# Patient Record
Sex: Male | Born: 1935 | Race: White | Hispanic: No | Marital: Married | Smoking: Never smoker
Health system: Southern US, Community
[De-identification: ages and names within clinical notes are randomized; demographics above are authoritative.]

## PROBLEM LIST (undated history)

## (undated) DIAGNOSIS — N289 Disorder of kidney and ureter, unspecified: Secondary | ICD-10-CM

## (undated) DIAGNOSIS — E119 Type 2 diabetes mellitus without complications: Secondary | ICD-10-CM

## (undated) DIAGNOSIS — F0390 Unspecified dementia without behavioral disturbance: Secondary | ICD-10-CM

## (undated) DIAGNOSIS — E079 Disorder of thyroid, unspecified: Secondary | ICD-10-CM

## (undated) HISTORY — PX: THYROID SURGERY: SHX805

## (undated) HISTORY — PX: ANKLE SURGERY: SHX546

## (undated) HISTORY — PX: HIP SURGERY: SHX245

---

## 2003-11-15 ENCOUNTER — Ambulatory Visit: Payer: Self-pay | Admitting: Radiation Oncology

## 2003-11-30 ENCOUNTER — Ambulatory Visit: Payer: Self-pay | Admitting: Radiation Oncology

## 2003-12-30 ENCOUNTER — Ambulatory Visit: Payer: Self-pay | Admitting: Radiation Oncology

## 2004-01-30 ENCOUNTER — Ambulatory Visit: Payer: Self-pay | Admitting: Radiation Oncology

## 2004-03-01 ENCOUNTER — Ambulatory Visit: Payer: Self-pay | Admitting: Radiation Oncology

## 2004-06-08 ENCOUNTER — Ambulatory Visit: Payer: Self-pay | Admitting: Radiation Oncology

## 2004-06-29 ENCOUNTER — Ambulatory Visit: Payer: Self-pay | Admitting: Radiation Oncology

## 2004-12-13 ENCOUNTER — Ambulatory Visit: Payer: Self-pay | Admitting: Radiation Oncology

## 2004-12-29 ENCOUNTER — Ambulatory Visit: Payer: Self-pay | Admitting: Radiation Oncology

## 2006-02-11 ENCOUNTER — Ambulatory Visit: Payer: Self-pay | Admitting: Radiation Oncology

## 2006-03-01 ENCOUNTER — Ambulatory Visit: Payer: Self-pay | Admitting: Radiation Oncology

## 2007-01-15 ENCOUNTER — Ambulatory Visit: Payer: Self-pay | Admitting: Unknown Physician Specialty

## 2007-01-30 ENCOUNTER — Ambulatory Visit: Payer: Self-pay | Admitting: Radiation Oncology

## 2007-02-20 ENCOUNTER — Ambulatory Visit: Payer: Self-pay | Admitting: Radiation Oncology

## 2007-03-02 ENCOUNTER — Ambulatory Visit: Payer: Self-pay | Admitting: Radiation Oncology

## 2007-03-11 ENCOUNTER — Inpatient Hospital Stay: Payer: Self-pay | Admitting: Unknown Physician Specialty

## 2008-01-30 ENCOUNTER — Ambulatory Visit: Payer: Self-pay | Admitting: Radiation Oncology

## 2008-02-19 ENCOUNTER — Ambulatory Visit: Payer: Self-pay | Admitting: Radiation Oncology

## 2008-03-01 ENCOUNTER — Ambulatory Visit: Payer: Self-pay | Admitting: Radiation Oncology

## 2009-01-29 ENCOUNTER — Ambulatory Visit: Payer: Self-pay | Admitting: Radiation Oncology

## 2009-02-17 ENCOUNTER — Ambulatory Visit: Payer: Self-pay | Admitting: Radiation Oncology

## 2009-03-01 ENCOUNTER — Ambulatory Visit: Payer: Self-pay | Admitting: Radiation Oncology

## 2010-02-23 ENCOUNTER — Ambulatory Visit: Payer: Self-pay | Admitting: Family Medicine

## 2010-03-17 ENCOUNTER — Ambulatory Visit: Payer: Self-pay | Admitting: Radiation Oncology

## 2010-03-18 LAB — PSA

## 2010-03-30 ENCOUNTER — Ambulatory Visit: Payer: Self-pay | Admitting: Radiation Oncology

## 2010-04-13 ENCOUNTER — Ambulatory Visit: Payer: Self-pay | Admitting: Family Medicine

## 2010-04-17 ENCOUNTER — Ambulatory Visit: Payer: Self-pay | Admitting: Family Medicine

## 2010-08-23 ENCOUNTER — Ambulatory Visit: Payer: Self-pay | Admitting: Family Medicine

## 2010-09-25 ENCOUNTER — Emergency Department: Payer: Self-pay | Admitting: *Deleted

## 2011-03-09 ENCOUNTER — Ambulatory Visit: Payer: Self-pay | Admitting: Family Medicine

## 2011-03-23 ENCOUNTER — Ambulatory Visit: Payer: Self-pay | Admitting: Radiation Oncology

## 2011-03-24 LAB — PSA: PSA: 1.3 ng/mL (ref 0.0–4.0)

## 2011-03-30 ENCOUNTER — Ambulatory Visit: Payer: Self-pay | Admitting: Radiation Oncology

## 2011-09-19 IMAGING — CR DG LUMBAR SPINE COMPLETE 4+V
1 series · 5 of 5 positions shown · non-contrast
Comparison: none

REASON FOR EXAM: left foot drop
COMMENTS:

[Series 1: view not recorded · 0.17mm/px · 5 of 5 slices shown]
[im 1/5]
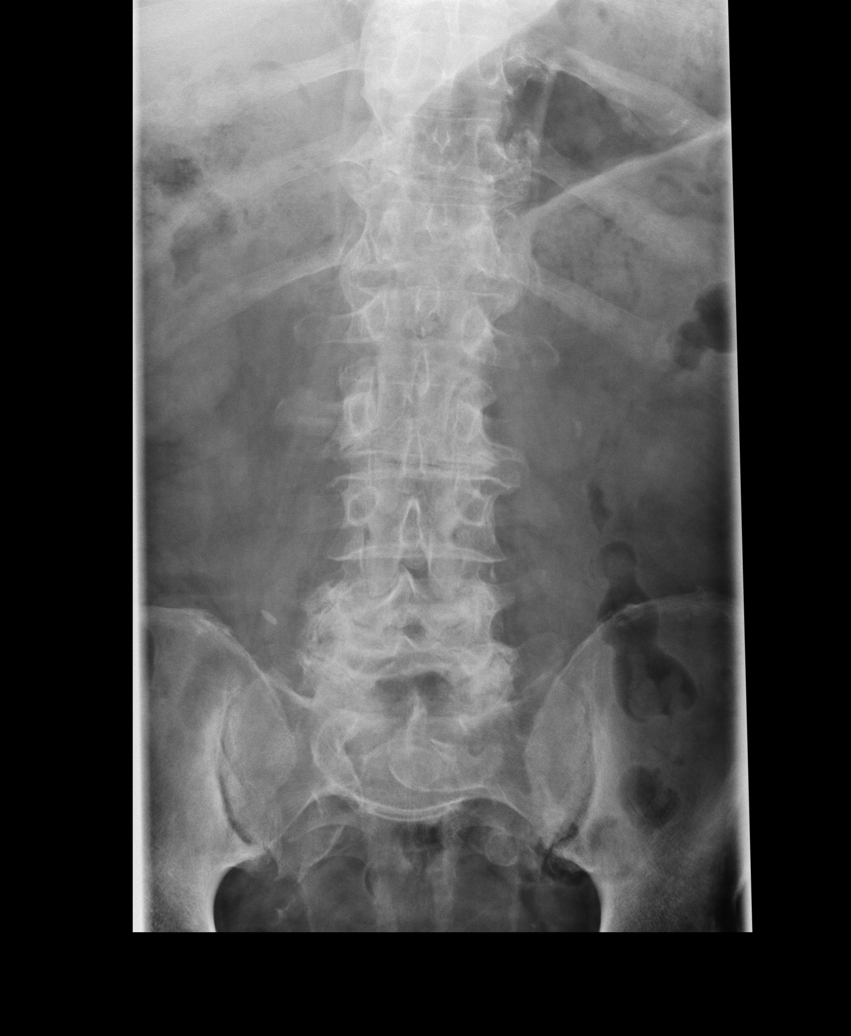
[im 2/5]
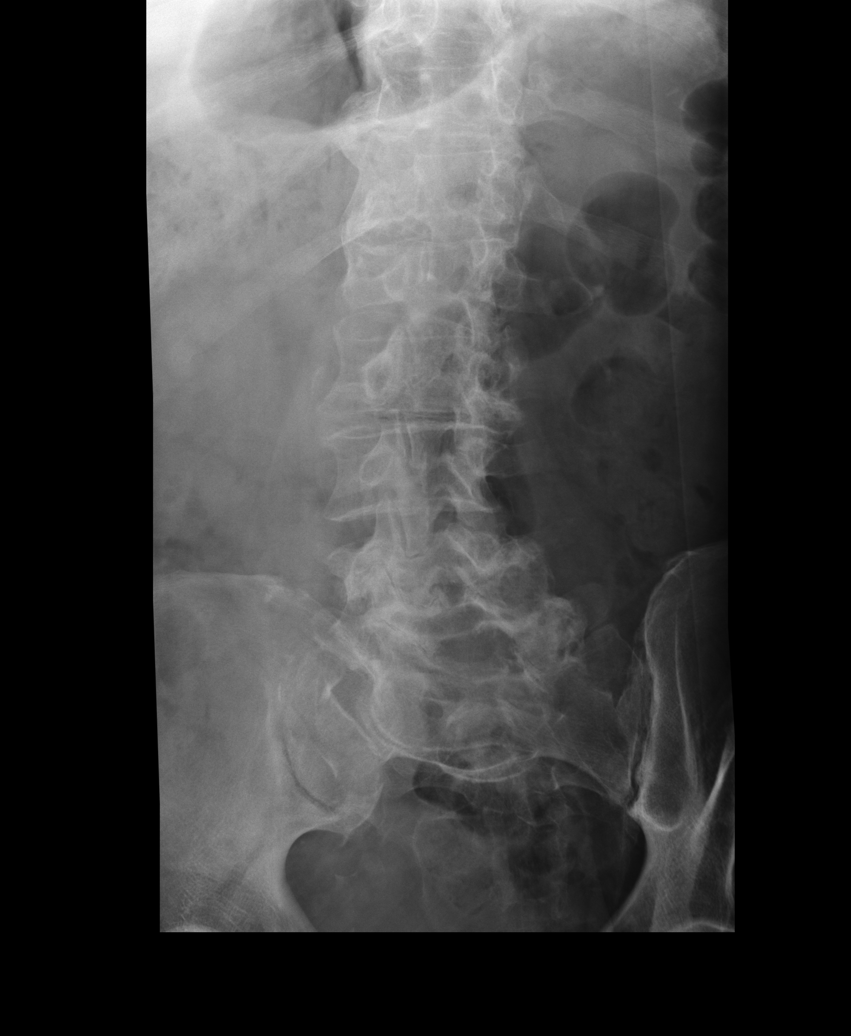
[im 3/5]
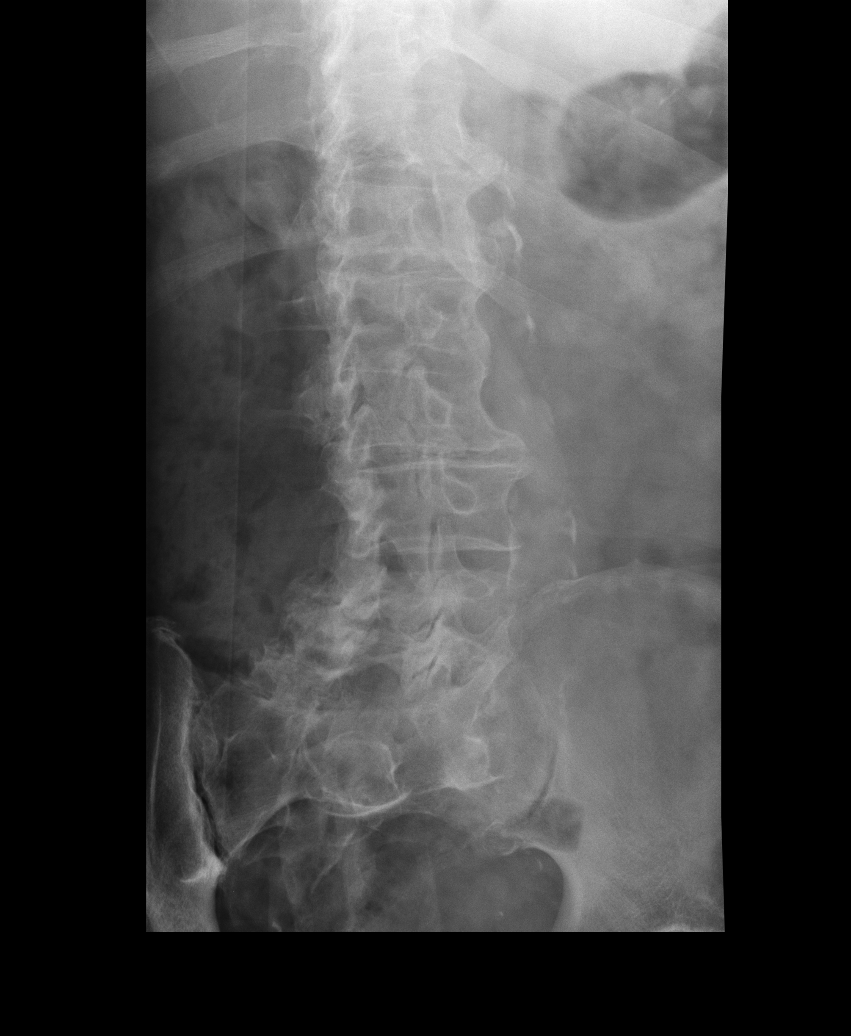
[im 4/5]
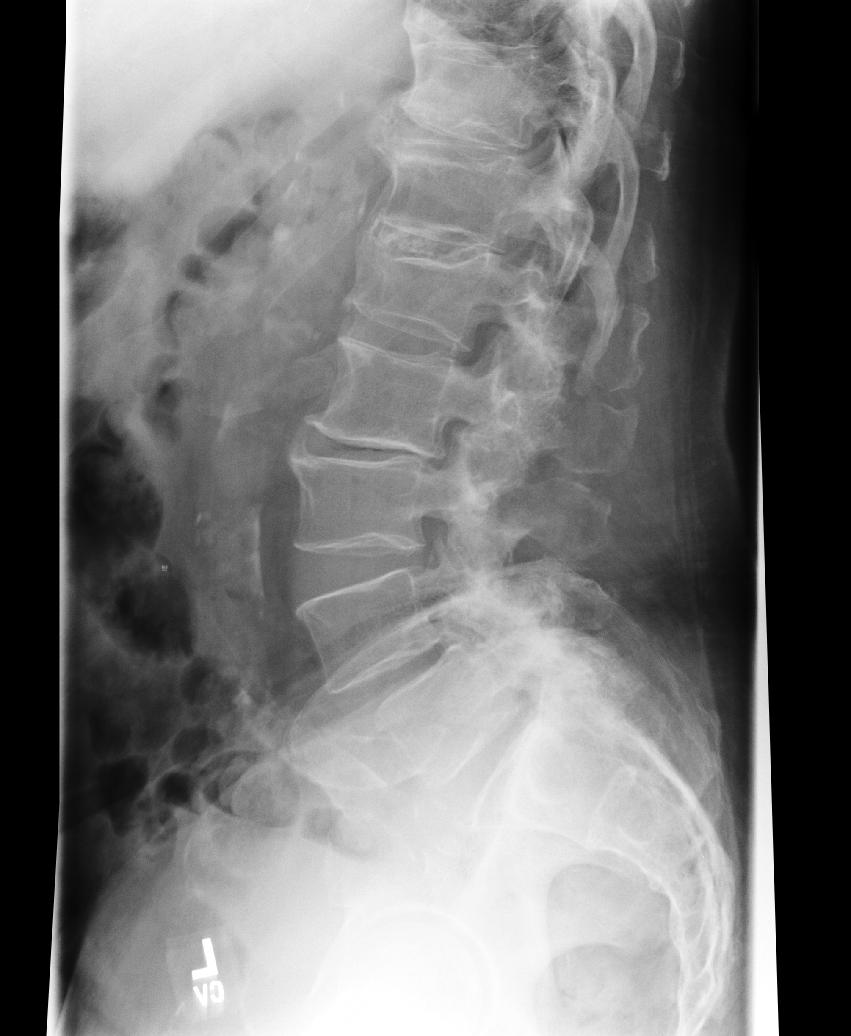
[im 5/5]
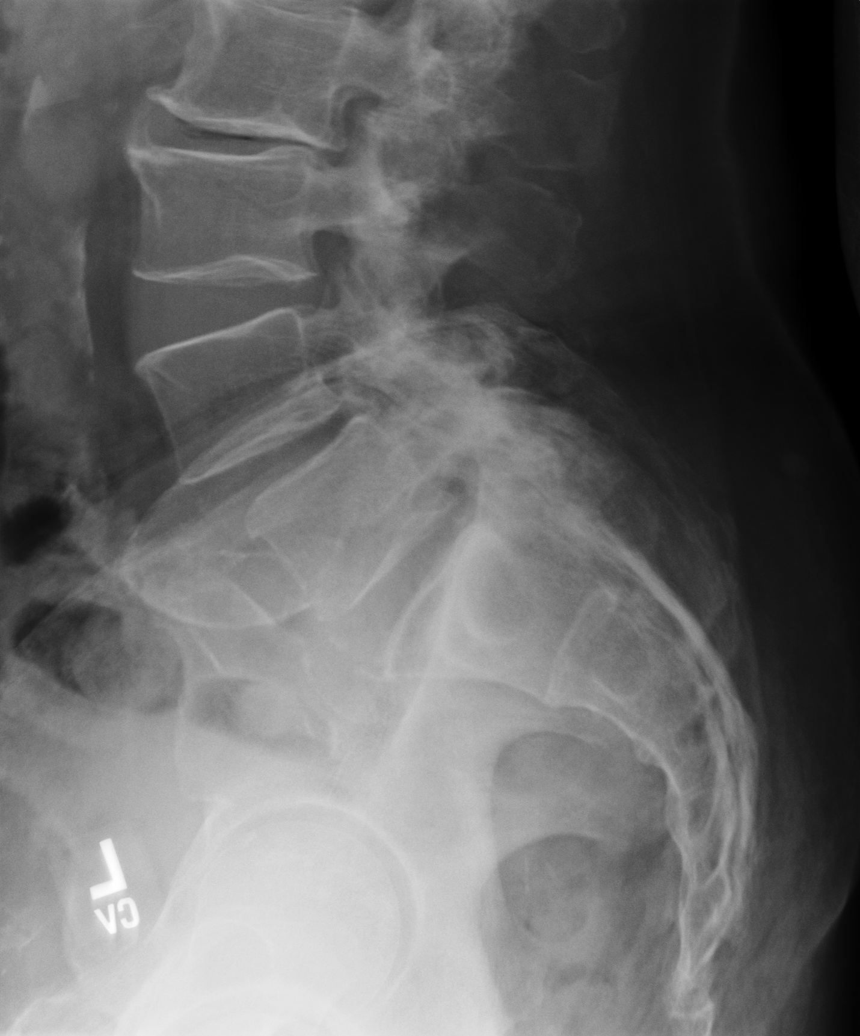

[5 of 5 positions shown; findings below may reference images not displayed]

PROCEDURE:     KDR - KDXR LUMBAR SPINE WITH OBLIQUES  - April 13, 2010  [DATE]

RESULT:     There is approximately 20 to 30% anterior compression of L1 that
radiographically appears old. No acute fracture of the lumbar spine is seen.
There is narrowing of the L2-L3 and of the L4-L5 intervertebral disc spaces
consistent with disc disease and more prominent at L2-L3. This could be
further evaluated by MR if clinically indicated. In the lateral view, there
is noted a 5 mm retrolisthesis of L2 on L3. This would appear to be on a
degenerative basis. There is also noted approximately 6 mm retrolisthesis of
L3 on L4. No associated spondylolysis is seen. Oblique views show an
apparent minimally displaced cleft or fracture through the pars
interarticularis at L2 on the left. The pedicles are bilaterally intact.
Arthritic change is noted involving the articular facets of L5 and S1
bilaterally. No lytic or blastic lesions of the lumbar spine are seen.
IMPRESSION: 1. There are observed changes consistent with disc disease at L2-L3 and
L4-L5. The findings are more prominent at L2-L3.
2. There is an old-appearing vertebral compression deformity of L1.
3. There is a slight spondylolisthesis noted at L2-L3 and at L3-L4.
4. There is a defect in the pars interarticularis at L2 on the left. This
likely is secondary to prior fracture. The age of this finding is uncertain.

## 2012-01-29 IMAGING — CT CT CHEST W/O CM
1 series · 15 of 33 positions shown, 19 images · non-contrast
Comparison: none

REASON FOR EXAM: pulmonary nodule follow up CT 8115 doctor wants without
COMMENTS:

PROCEDURE:     CT  - CT CHEST WITHOUT CONTRAST  - August 23, 2010 [DATE]
RESULT:     Comparison: 02/23/2010
TECHNIQUE: Multiple axial images of the chest were obtained without
intravenous contrast.

[Series 2: soft tissue · axial · 0.75mm/px · z∈[+405,+680]mm · 15 of 65 slices shown, 19 images]
[im 5/65  mediastinal]
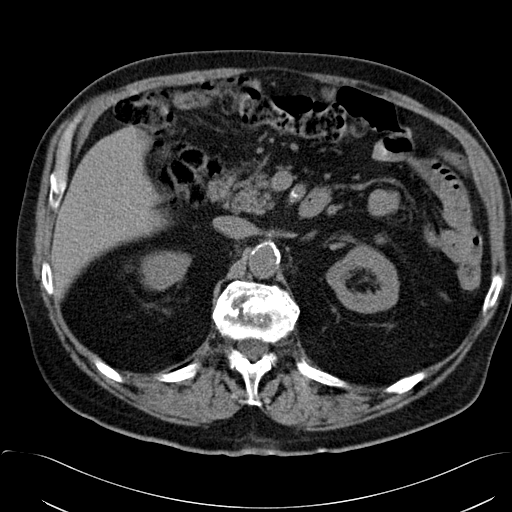
[im 5/65  lung]
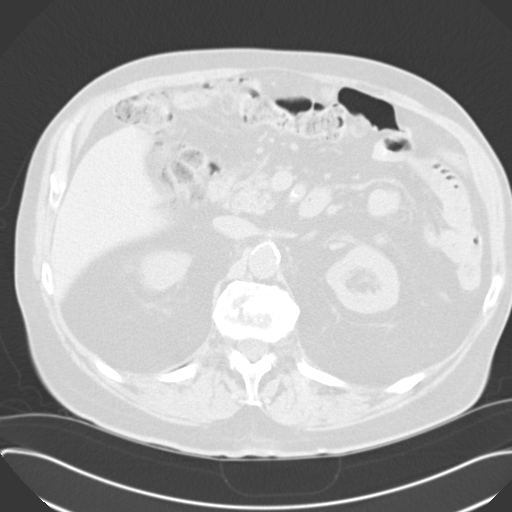
[im 10/65  lung]
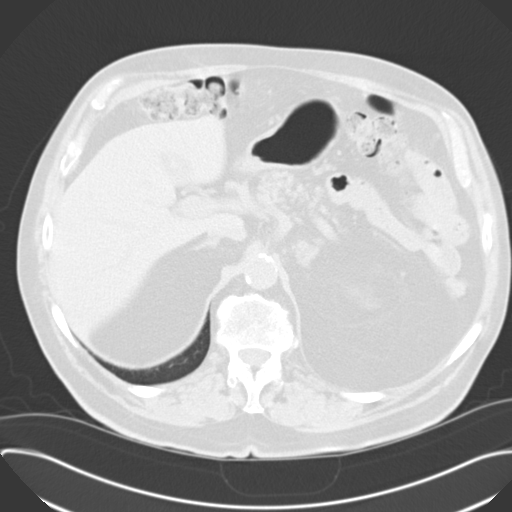
[im 13/65  lung]
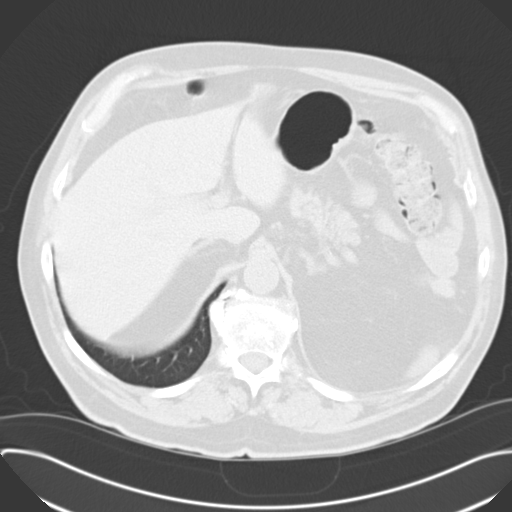
[im 17/65  lung]
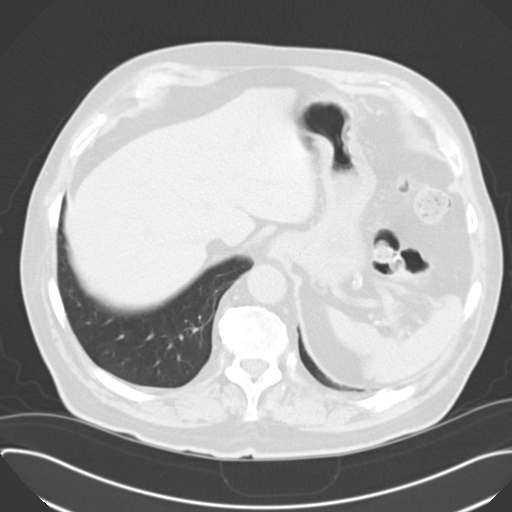
[im 22/65  mediastinal]
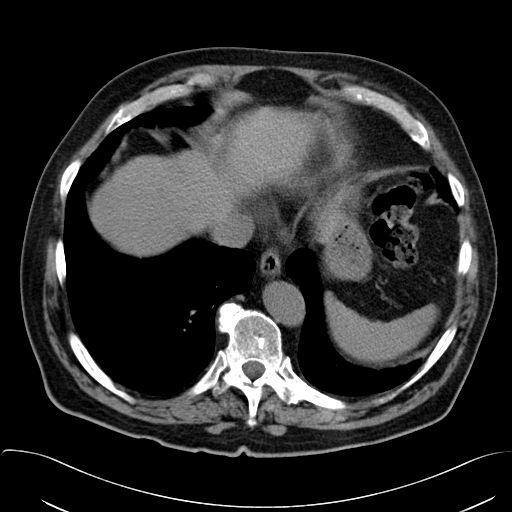
[im 22/65  lung]
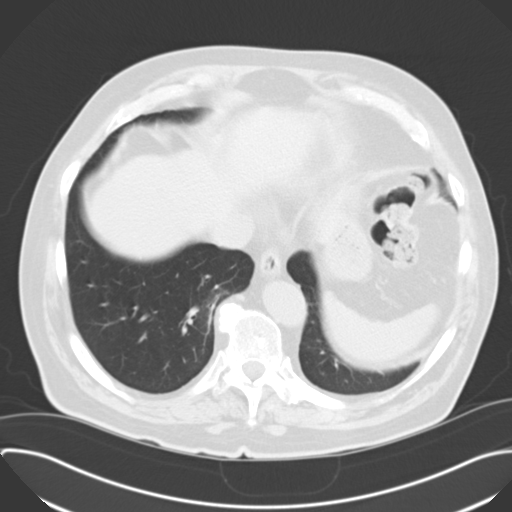
[im 26/65  lung]
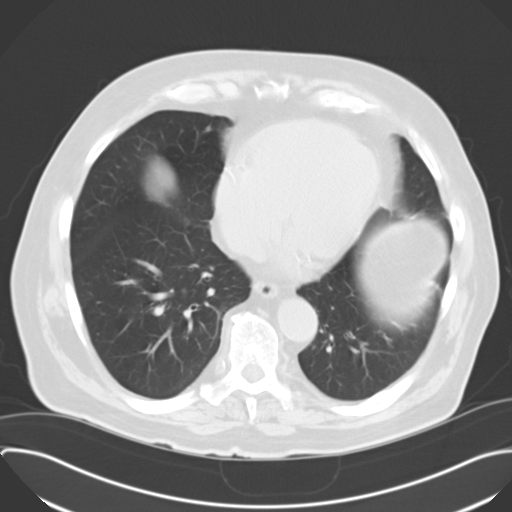
[im 29/65  lung]
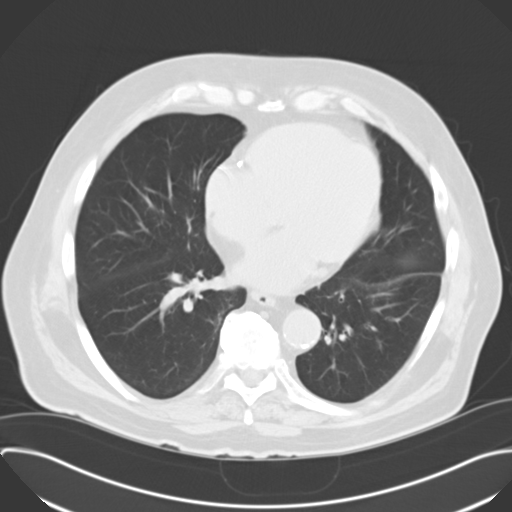
[im 34/65  lung]
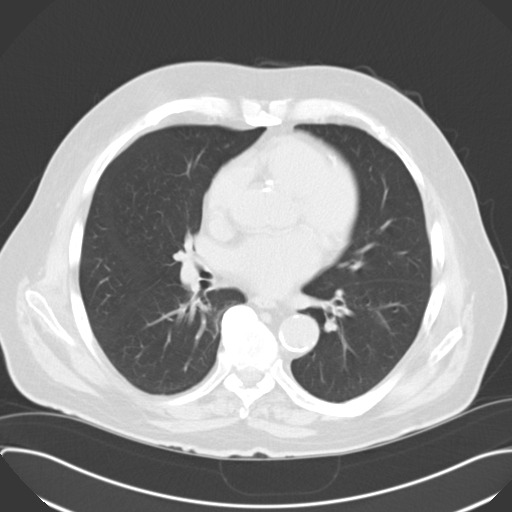
[im 36/65  mediastinal]
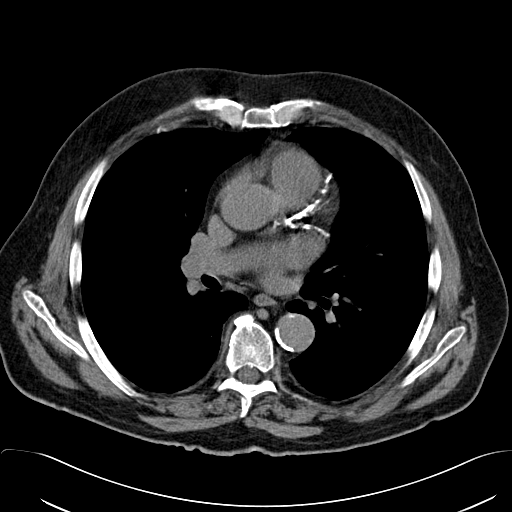
[im 36/65  lung]
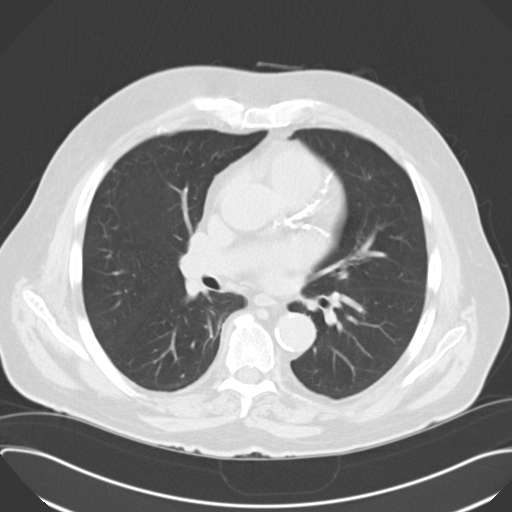
[im 39/65  lung]
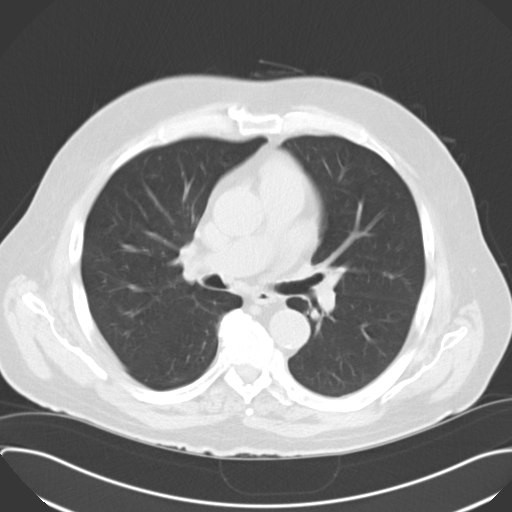
[im 43/65  lung]
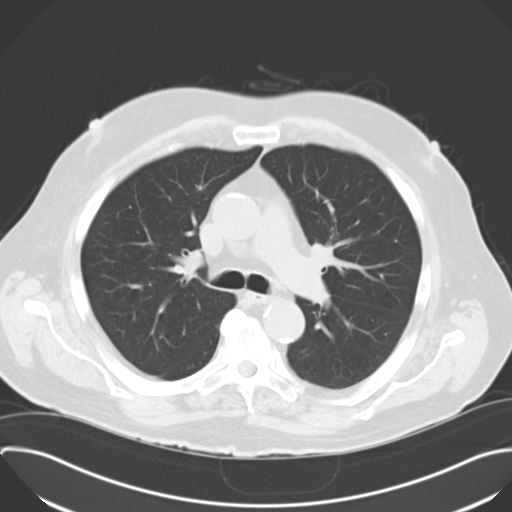
[im 48/65  lung]
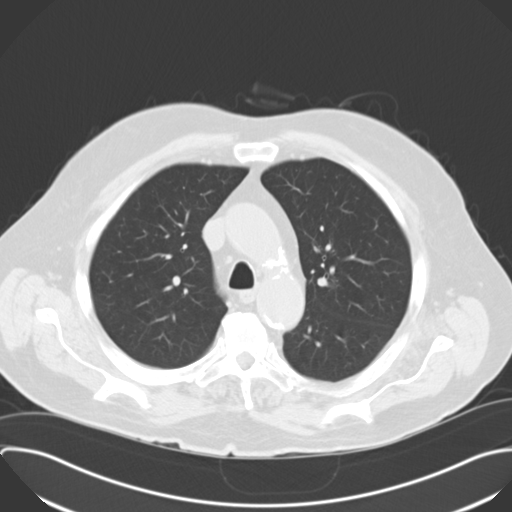
[im 52/65  mediastinal]
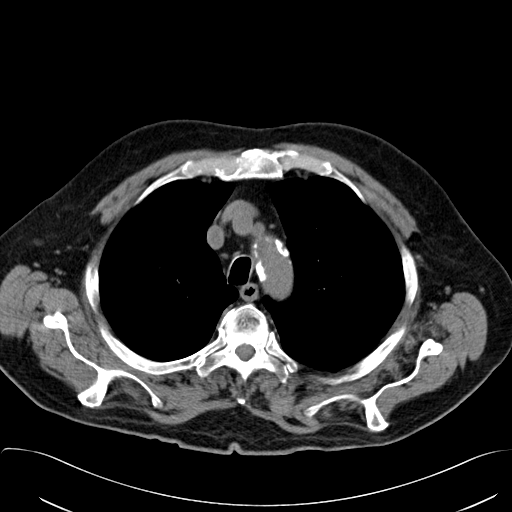
[im 52/65  lung]
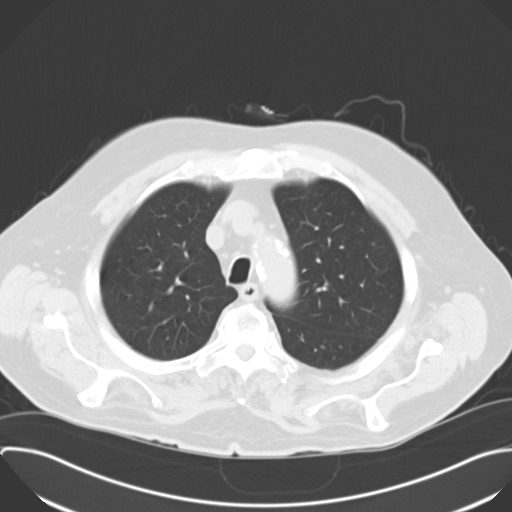
[im 55/65  lung]
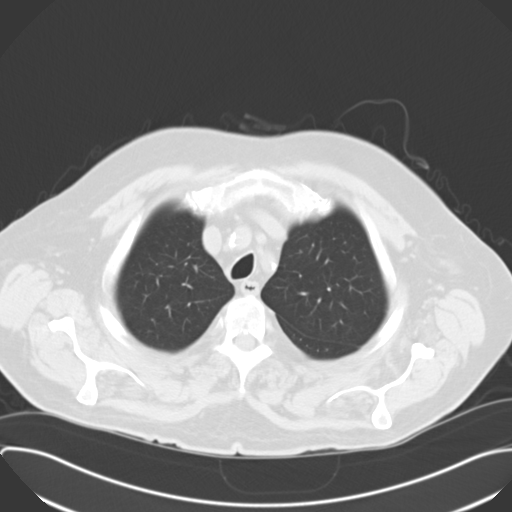
[im 60/65  lung]
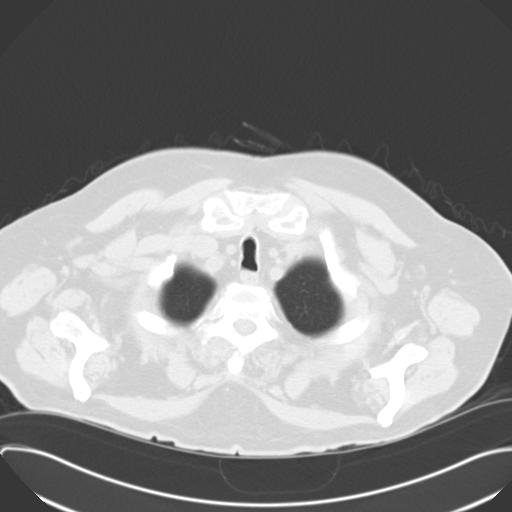

[15 of 33 positions shown; findings below may reference images not displayed]

FINDINGS: No mediastinal, hilar, or axillary lymphadenopathy. Calcifications are seen
in the coronary arteries. Multiple stones are demonstrated in the
gallbladder. Small low-attenuation lesion exophytic from the kidneys are too
small to characterize, but unchanged from prior. 5 mm nodule in the right
middle lobe is unchanged. There is a 4 mm nodule in the posterior left lower
lobe as seen on image 33. Comparison with prior is difficult secondary to
atelectasis and pleural fluid in this location the prior study. There is
interval resolution of the left pleural effusion.

There are multiple old left posterior rib fractures.
IMPRESSION: Unchanged subcentimeter pulmonary nodules, the largest of which measures 5
mm. A followup noncontrast chest CT is recommended in 6 months.

## 2012-03-19 ENCOUNTER — Ambulatory Visit: Payer: Self-pay | Admitting: Radiation Oncology

## 2012-03-22 LAB — PSA: PSA: 1.1 ng/mL (ref 0.0–4.0)

## 2012-03-29 ENCOUNTER — Ambulatory Visit: Payer: Self-pay | Admitting: Radiation Oncology

## 2012-04-20 ENCOUNTER — Emergency Department: Payer: Self-pay | Admitting: Emergency Medicine

## 2012-07-18 LAB — COMPREHENSIVE METABOLIC PANEL
Albumin: 3.4 g/dL (ref 3.4–5.0)
Alkaline Phosphatase: 86 U/L (ref 50–136)
BUN: 29 mg/dL — ABNORMAL HIGH (ref 7–18)
Calcium, Total: 8.7 mg/dL (ref 8.5–10.1)
Co2: 29 mmol/L (ref 21–32)
Glucose: 188 mg/dL — ABNORMAL HIGH (ref 65–99)
SGPT (ALT): 15 U/L (ref 12–78)
Total Protein: 7.7 g/dL (ref 6.4–8.2)

## 2012-07-18 LAB — CK TOTAL AND CKMB (NOT AT ARMC): CK, Total: 125 U/L (ref 35–232)

## 2012-07-18 LAB — BASIC METABOLIC PANEL
Anion Gap: 6 — ABNORMAL LOW (ref 7–16)
BUN: 31 mg/dL — ABNORMAL HIGH (ref 7–18)
Calcium, Total: 8.2 mg/dL — ABNORMAL LOW (ref 8.5–10.1)
Chloride: 103 mmol/L (ref 98–107)
Creatinine: 2.1 mg/dL — ABNORMAL HIGH (ref 0.60–1.30)
EGFR (African American): 33 — ABNORMAL LOW
EGFR (Non-African Amer.): 28 — ABNORMAL LOW
Osmolality: 279 (ref 275–301)
Potassium: 4.5 mmol/L (ref 3.5–5.1)
Sodium: 133 mmol/L — ABNORMAL LOW (ref 136–145)

## 2012-07-18 LAB — URINALYSIS, COMPLETE
Bilirubin,UR: NEGATIVE
Glucose,UR: 50 mg/dL (ref 0–75)
Ph: 5 (ref 4.5–8.0)
Protein: 30
Specific Gravity: 1.017 (ref 1.003–1.030)
WBC UR: 72 /HPF (ref 0–5)

## 2012-07-18 LAB — CBC
HCT: 41.7 % (ref 40.0–52.0)
HGB: 13.9 g/dL (ref 13.0–18.0)
MCHC: 33.2 g/dL (ref 32.0–36.0)
MCV: 90 fL (ref 80–100)
RBC: 4.64 10*6/uL (ref 4.40–5.90)
WBC: 13.2 10*3/uL — ABNORMAL HIGH (ref 3.8–10.6)

## 2012-07-18 LAB — TROPONIN I: Troponin-I: <0.02 ng/mL

## 2012-07-19 ENCOUNTER — Inpatient Hospital Stay: Payer: Self-pay | Admitting: Student

## 2012-07-19 LAB — HEMOGLOBIN A1C: Hemoglobin A1C: 8.3 % — ABNORMAL HIGH (ref 4.2–6.3)

## 2012-07-19 LAB — URINE CULTURE

## 2012-07-20 LAB — BASIC METABOLIC PANEL
BUN: 28 mg/dL — ABNORMAL HIGH (ref 7–18)
Calcium, Total: 8 mg/dL — ABNORMAL LOW (ref 8.5–10.1)
Chloride: 105 mmol/L (ref 98–107)
Co2: 24 mmol/L (ref 21–32)
Creatinine: 1.52 mg/dL — ABNORMAL HIGH (ref 0.60–1.30)
EGFR (African American): 48 — ABNORMAL LOW
EGFR (Non-African Amer.): 42 — ABNORMAL LOW
Osmolality: 281 (ref 275–301)

## 2012-07-20 LAB — CBC WITH DIFFERENTIAL/PLATELET
Basophil #: 0 10*3/uL (ref 0.0–0.1)
Eosinophil #: 0.1 10*3/uL (ref 0.0–0.7)
Eosinophil %: 1.6 %
HCT: 36.2 % — ABNORMAL LOW (ref 40.0–52.0)
Lymphocyte %: 9.4 %
MCH: 30.1 pg (ref 26.0–34.0)
MCV: 88 fL (ref 80–100)
Monocyte #: 0.9 x10 3/mm (ref 0.2–1.0)
Monocyte %: 14.8 %
Platelet: 203 10*3/uL (ref 150–440)
RBC: 4.09 10*6/uL — ABNORMAL LOW (ref 4.40–5.90)
WBC: 6.2 10*3/uL (ref 3.8–10.6)

## 2012-07-31 ENCOUNTER — Ambulatory Visit: Payer: Self-pay | Admitting: Nephrology

## 2012-08-14 ENCOUNTER — Ambulatory Visit: Payer: Self-pay | Admitting: Nephrology

## 2013-02-28 ENCOUNTER — Emergency Department: Payer: Self-pay | Admitting: Neurology

## 2013-02-28 LAB — URINALYSIS, COMPLETE
Bilirubin,UR: NEGATIVE
Glucose,UR: 150 mg/dL (ref 0–75)
Ketone: NEGATIVE
NITRITE: NEGATIVE
Ph: 5 (ref 4.5–8.0)
RBC,UR: 2194 /HPF (ref 0–5)
SPECIFIC GRAVITY: 1.016 (ref 1.003–1.030)
Squamous Epithelial: NONE SEEN
WBC UR: 179 /HPF (ref 0–5)

## 2013-02-28 LAB — CBC
HCT: 40.5 % (ref 40.0–52.0)
HGB: 13.5 g/dL (ref 13.0–18.0)
MCH: 30 pg (ref 26.0–34.0)
MCHC: 33.5 g/dL (ref 32.0–36.0)
MCV: 90 fL (ref 80–100)
Platelet: 227 10*3/uL (ref 150–440)
RBC: 4.52 10*6/uL (ref 4.40–5.90)
RDW: 13.1 % (ref 11.5–14.5)
WBC: 9.6 10*3/uL (ref 3.8–10.6)

## 2013-02-28 LAB — BASIC METABOLIC PANEL
ANION GAP: 5 — AB (ref 7–16)
BUN: 24 mg/dL — ABNORMAL HIGH (ref 7–18)
CHLORIDE: 104 mmol/L (ref 98–107)
CO2: 27 mmol/L (ref 21–32)
CREATININE: 1.44 mg/dL — AB (ref 0.60–1.30)
Calcium, Total: 8.7 mg/dL (ref 8.5–10.1)
GFR CALC AF AMER: 51 — AB
GFR CALC NON AF AMER: 44 — AB
GLUCOSE: 173 mg/dL — AB (ref 65–99)
Osmolality: 280 (ref 275–301)
Potassium: 4.1 mmol/L (ref 3.5–5.1)
SODIUM: 136 mmol/L (ref 136–145)

## 2013-03-01 ENCOUNTER — Emergency Department: Payer: Self-pay

## 2013-03-01 LAB — URINALYSIS, COMPLETE
Bacteria: NONE SEEN
RBC,UR: 4267 /HPF (ref 0–5)
SPECIFIC GRAVITY: 1.018 (ref 1.003–1.030)
Squamous Epithelial: NONE SEEN
WBC UR: 227 /HPF (ref 0–5)

## 2013-03-01 LAB — CBC
HCT: 40.6 % (ref 40.0–52.0)
HGB: 13.7 g/dL (ref 13.0–18.0)
MCH: 30.1 pg (ref 26.0–34.0)
MCHC: 33.8 g/dL (ref 32.0–36.0)
MCV: 89 fL (ref 80–100)
Platelet: 221 10*3/uL (ref 150–440)
RBC: 4.55 10*6/uL (ref 4.40–5.90)
RDW: 13.1 % (ref 11.5–14.5)
WBC: 9.1 10*3/uL (ref 3.8–10.6)

## 2013-03-01 LAB — BASIC METABOLIC PANEL
Anion Gap: 3 — ABNORMAL LOW (ref 7–16)
BUN: 23 mg/dL — ABNORMAL HIGH (ref 7–18)
CREATININE: 1.38 mg/dL — AB (ref 0.60–1.30)
Calcium, Total: 8.7 mg/dL (ref 8.5–10.1)
Chloride: 103 mmol/L (ref 98–107)
Co2: 25 mmol/L (ref 21–32)
EGFR (African American): 54 — ABNORMAL LOW
EGFR (Non-African Amer.): 47 — ABNORMAL LOW
GLUCOSE: 144 mg/dL — AB (ref 65–99)
Osmolality: 269 (ref 275–301)
Potassium: 4.3 mmol/L (ref 3.5–5.1)
SODIUM: 131 mmol/L — AB (ref 136–145)

## 2013-03-03 LAB — URINE CULTURE

## 2013-03-20 ENCOUNTER — Ambulatory Visit: Payer: Self-pay | Admitting: Radiation Oncology

## 2013-03-24 LAB — PSA: PSA: 1.2 ng/mL (ref 0.0–4.0)

## 2013-03-29 ENCOUNTER — Ambulatory Visit: Payer: Self-pay | Admitting: Radiation Oncology

## 2013-04-30 ENCOUNTER — Ambulatory Visit: Payer: Self-pay | Admitting: Urology

## 2013-04-30 DIAGNOSIS — E119 Type 2 diabetes mellitus without complications: Secondary | ICD-10-CM

## 2013-04-30 LAB — BASIC METABOLIC PANEL
Anion Gap: 4 — ABNORMAL LOW (ref 7–16)
BUN: 20 mg/dL — ABNORMAL HIGH (ref 7–18)
CALCIUM: 8.4 mg/dL — AB (ref 8.5–10.1)
CO2: 28 mmol/L (ref 21–32)
CREATININE: 1.37 mg/dL — AB (ref 0.60–1.30)
Chloride: 105 mmol/L (ref 98–107)
GFR CALC AF AMER: 55 — AB
GFR CALC NON AF AMER: 47 — AB
GLUCOSE: 123 mg/dL — AB (ref 65–99)
Osmolality: 278 (ref 275–301)
POTASSIUM: 4.4 mmol/L (ref 3.5–5.1)
SODIUM: 137 mmol/L (ref 136–145)

## 2013-04-30 LAB — CBC
HCT: 39.9 % — ABNORMAL LOW (ref 40.0–52.0)
HGB: 13 g/dL (ref 13.0–18.0)
MCH: 29.1 pg (ref 26.0–34.0)
MCHC: 32.7 g/dL (ref 32.0–36.0)
MCV: 89 fL (ref 80–100)
Platelet: 237 10*3/uL (ref 150–440)
RBC: 4.48 10*6/uL (ref 4.40–5.90)
RDW: 13.7 % (ref 11.5–14.5)
WBC: 6.7 10*3/uL (ref 3.8–10.6)

## 2013-05-11 ENCOUNTER — Ambulatory Visit: Payer: Self-pay | Admitting: Urology

## 2013-05-29 ENCOUNTER — Ambulatory Visit: Payer: Self-pay | Admitting: Radiation Oncology

## 2013-09-08 ENCOUNTER — Ambulatory Visit: Payer: Self-pay | Admitting: Nephrology

## 2013-09-20 ENCOUNTER — Emergency Department: Payer: Self-pay | Admitting: Emergency Medicine

## 2014-05-21 NOTE — H&P (Signed)
PATIENT NAME:  Brent Pollard, Brent Pollard MR#:  [id number] DATE OF BIRTH:  1926-11-03  DATE OF ADMISSION:  07/18/2012  REFERRING PHYSICIAN: Dr. Lurline Hare.   PRIMARY CARE PHYSICIAN: Dr. Golden Pop.   CHIEF COMPLAINT: Weakness.   HISTORY OF PRESENT ILLNESS: This is an 81 year old gentleman with known past medical history of chronic kidney disease, hypertension, hyperlipidemia, diabetes, presents with complaint of weakness. As per wife, the patient has been feeling weak over the last 24 hours. Had fever of 101. As well, had decreased p.o. intake which prompted her to bring him to the ED. In the ED, the patient had positive urinalysis with 72 white blood cells and +2 leukocyte esterase. Also, the patient has been complaining of cough, nonproductive. A chest x-ray was done which did not show any opacity. The patient is an extremely poor historian. Most of the history was obtained from wife and daughter at bedside. The patient was afebrile in the ED. The patient is known to have history of chronic kidney disease. Last blood work that could be found in [facility] records was last year with the creatinine being 1.45. Today, creatinine was 2.11. patient's wife  reports that patient had his ramipril stopped as an outpatient. It is unclear if this was due to worsening renal function as there is no recent lab we could see since then. As well, we have no access to Dr. Rance Muir records. The patient denies any chest pain, any shortness of breath, any coffee-ground emesis, any nausea, any vomiting, any constipation. Reports he had diarrhea before a week for a couple of episodes but not since then. Since then, he has been having regular bowel movements. Given the patient's significant weakness and his worsening renal function, hospitalist service was requested to admit the patient.   ALLERGIES: No known drug allergies.   HOME MEDICATIONS:  1. Levothroid 75 mcg oral daily.  2. Glipizide extended release 2.5 mg oral  daily.  3. Prandin 0.5 mg oral 3 times a day.  4. Ramipril 10 mg oral daily. This was supposed to be stopped as per his wife.  5. Tylenol as needed.  6. Vitamin B12 1000 mcg oral daily.  7. Exelon patch 24-hour 4.6 mg oral daily.  8. Victoza 1.2 subcutaneous oral daily.  9. Pravastatin 20 mg at bedtime.   PAST MEDICAL HISTORY:  1. Prostate cancer.  2. Diabetes.  3. Hypertension.  4. Dyslipidemia.  5. Footdrop.  6. Hypothyroidism.   SOCIAL HISTORY: The patient is married, lives with his wife at home. No alcohol. No drug abuse.   FAMILY HISTORY: Significant for hypertension.   REVIEW OF SYSTEMS:  CONSTITUTIONAL: The patient is an extremely poor historian but he denies any fever, but daughter reports he was febrile at 101 at home. Complains of generalized weakness, fatigue. Denies weight gain, weight loss.  EYES: Denies blurry vision, double vision, inflammation, glaucoma.  ENT: Denies tinnitus, ear pain, hearing loss, epistaxis or discharge.  RESPIRATORY: Complains of cough. Denies wheezing, hemoptysis, dyspnea or productive sputum.  CARDIOVASCULAR: Denies chest pain, orthopnea, edema, arrhythmia, palpitations, syncope.  GASTROINTESTINAL: Denies nausea, vomiting, diarrhea, abdominal pain, hematemesis, jaundice, rectal bleed or constipation. Had episode of diarrhea before a week but nothing since then.  GENITOURINARY: Complains of dysuria, polyuria. Denies any renal colic. Has history of prostate cancer.  ENDOCRINE: Denies polydipsia, heat or cold intolerance.  HEMATOLOGY: Denies anemia, easy bruising, bleeding diathesis.  INTEGUMENT: Denies acne, rash or skin lesions.  MUSCULOSKELETAL: Denies any swelling, gout or redness.  NEUROLOGIC:  Denies any ataxia, headache, CVA, TIA, seizures. Daughter reports he is having early dementia.  PSYCHIATRIC: Denies anxiety, insomnia, bipolar disorder, schizophrenia.   PHYSICAL EXAMINATION:  VITAL SIGNS: Temperature 98.6, pulse 74, respiratory rate  18, blood pressure 126/54, saturating 97% on oxygen.  GENERAL: Elderly male, looks comfortable in bed, in no apparent distress.  HEENT: Head atraumatic, normocephalic. Pupils equal and reactive to light. Pink conjunctivae. Anicteric sclerae. Moist oral mucosa.  NECK: Supple. No thyromegaly. No JVD.  CHEST: Good air entry bilaterally. No wheezing, rales or rhonchi.  CARDIOVASCULAR: S1, S2 heard. No rubs, murmurs or gallops.  ABDOMEN: Soft, nontender and nondistended. Bowel sounds present.  EXTREMITIES: No edema. No clubbing. No cyanosis. Dorsalis pedis pulse +2 bilaterally.  PSYCHIATRIC: The patient is awake, alert, oriented x2. Mildly confused but pleasant.  NEUROLOGIC: Cranial nerves grossly intact. No focal motor or sensory deficits.  SKIN: Red skin turgor. Warm and dry. No rash.   PERTINENT LABS: Glucose 188, BUN 29, creatinine 2.11, sodium 134, potassium 5, chloride 102, CO2 29. Total protein 7.7, albumin 3.4, total bili 1, alk phos 86, AST 17, ALT 15. Troponin less than 0.02. White blood cells 13.2, hemoglobin 13.9, hematocrit 41.7, platelets 243. Urinalysis showing +3 blood, leukocyte esterase +2, white blood cells 72.   EKG showing normal sinus rhythm without significant ST or T wave changes.   ASSESSMENT AND PLAN:  1. Generalized weakness: This is most likely due to general deconditioning worsened by infection secondary to urinary tract infection and acute bronchitis. Will consult physical therapy.  2. Urinary tract infection: Will have the patient on levofloxacin. Will follow on the urine culture.  3. Acute bronchitis: Will continue with levofloxacin.  4. Acute on chronic renal failure: The patient has worsening of creatinine. Last baseline known to [place] is 1.45. Currently, it is 2.11. Will continue to hold his ramipril. Will continue with gentle hydration. Will recheck level in 24 hours.  5. Diabetes mellitus: Will hold oral agents. Will continue with insulin sliding scale. Will check  hemoglobin A1c.  6. Hyperlipidemia: Continue with statin.  7. Hypertension: Currently, blood pressure is acceptable. Will monitor off medications.  8. Hypothyroidism: Continue with Synthroid.  9. Deep vein thrombosis prophylaxis: Subcutaneous heparin.   CODE STATUS: The patient is FULL CODE. Does not have a Living Will. Daughter and wife at bedside confirmed that.   TOTAL TIME SPENT ON ADMISSION AND PATIENT CARE: 55 minutes.   ____________________________ Albertine Patricia, MD dse:gb D: 07/18/2012 03:01:05 ET T: 07/18/2012 03:18:19 ET JOB#: [id number]  cc: Albertine Patricia, MD, <Dictator> Bishop Vanderwerf Graciela Husbands MD ELECTRONICALLY SIGNED 07/18/2012 6:43

## 2014-05-21 NOTE — Discharge Summary (Signed)
PATIENT NAME:  Brent Pollard, Brent Pollard MR#:  [id number] DATE OF BIRTH:  November 05, 1926  DATE OF ADMISSION:  07/18/2012 DATE OF DISCHARGE:  07/20/2012  CONSULTANTS:  Lavonia Dana, MD, from nephrology.   PRIMARY CARE PHYSICIAN:  Guadalupe Maple, MD  CHIEF COMPLAINT:  Weakness.   DISCHARGE DIAGNOSES:   1.  Weakness and deconditioning likely secondary to infection from urinary tract infection and acute bronchitis.  2.  Acute on chronic renal failure.  3.  Diabetes.  4.  Hypertension.  5.  Hyperlipidemia.  6.  Hypothyroidism.  7.  Mild dementia.  8.  History of foot drop.  9.  History of prostate cancer.   DISCHARGE MEDICATIONS:  Levaquin 250 mg daily for 7 days, levothyroxine 75 mcg daily, Prandin 0.5 mg 3 times a day, Tylenol 650 mg 2 times a day as needed for pain, vitamin B12 1000 mcg daily, Exelon patch 4.6 mg per 24 hours once a day, pravastatin 20 mg daily, aspirin 81 mg daily, Victoza 1.2 mg subcutaneous once a day, glipizide XL 2.5 mg once a day.   DIET:  Low sodium, low fat, low cholesterol, ADA diet.   ACTIVITY:  As tolerated.   FOLLOWUP:  Please follow with PCP and nephrologist within 1 to 2 weeks.   CODE STATUS:  The patient is full code.   SIGNIFICANT LABS AND IMAGING:  BUN on arrival 29, creatinine 2.11, sodium 134. Last creatinine today of 1.52, BUN of 28. Hemoglobin A1c of 8.3. LFTs on arrival within normal limits. WBC on arrival 13.2. Today, WBC is 6.2. Initial hemoglobin was 13.9, platelets 243. Blood cultures on arrival no growth to date. Urine cultures on arrival shows mixed flora; however, a UA was positive for 2+ leukocyte esterase with 72 WBCs, 2+ bacteria and 3+ blood. Chest x-ray, 1 view, showed no acute cardiopulmonary disease.   HISTORY OF PRESENT ILLNESS AND HOSPITAL COURSE:  For full details of H and P, please see the dictation on June 20th by Dr. Waldron Labs, but briefly, this is a pleasant 81 year old male who came in for having weakness for the last 24 hours prior to  admission including having fevers of 101, decreased p.o. intake and was noted to have a positive UA and negative x-ray of his chest. He did also have a bump in the creatinine. Per H and P, [facility] labs showed the patient to have creatinine of 1.45 in the last year. Therefore, he was admitted to the hospitalist service, started on IV fluids and blood and urine cultures were sent. The patient was also seen by physical therapy. The weakness did improve with IV fluids, antibiotics and treatment for the UTI. The patient was mildly hypoxic with O2 sat of 90% and did have some cough and therefore likely has bronchitis without pneumonia. He also did have leukocytosis on arrival. The leukocytosis trended down to normal and the patient was afebrile. Although blood cultures were negative, the urine cultures showed mixed flora and likely contamination. Given the renal failure and the positive UA, we have elected to treat him for a total of 10 days. Here, he has been on IV Levaquin and he will be discharged with renally dosed p.o. Levaquin. We do not know what his baseline is in regards to his GFR, but it is possible that this is acute on chronic renal failure, baseline stage III, and outpatient followup with nephrology was recommended. His hemoglobin A1c was checked and was 8.3. At this point, we will discharge him with his outpatient medications,  but Prandin could potentially be increased or the Victoza dose could be increased. His blood pressure is stable. He is full code.   TOTAL TIME SPENT:  35 minutes.    ____________________________ Vivien Presto, MD sa:si D: 07/20/2012 14:24:00 ET T: 07/20/2012 20:25:01 ET JOB#: [id number]  cc: Vivien Presto, MD, <Dictator> Guadalupe Maple, MD Vivien Presto MD ELECTRONICALLY SIGNED 07/27/2012 22:16

## 2014-05-22 NOTE — H&P (Signed)
PATIENT NAME:  Brent Pollard, Brent Pollard MR#:  [id number] DATE OF BIRTH:  1927/01/12  DATE OF ADMISSION:  05/11/2013  CHIEF COMPLAINT: Urinary retention.   HISTORY OF PRESENT ILLNESS: Mr. Reser is an 81 year old white male, who developed acute urinary retention requiring Foley catheter placement in the Emergency Room on February 01. He has failed trials of Flomax and Rapaflo. He currently performs intermittent clean self-catheterization 3 times daily. He comes in now for photovaporization of the prostate with the greenlight laser. Cystoscopy on March 11 revealed trilobar BPH with obstruction. The patient has a history of prostate cancer and is status post external beam radiation therapy in 2005. Most recent PSA level was 1.0 ng/mL on October 14, 2012.   ALLERGIES: SIMVASTATIN, VICODIN, CYCLOBENZAPRINE, LORAZEPAM AND BENADRYL.   CURRENT MEDICATIONS: Include Victoza, Flomax, vitamin B12, Ramipril, Prandin, Synthroid and vitamin D.   PAST SURGICAL HISTORY:  1.  Tonsillectomy in 1936. 2.  Knee surgery.   PAST AND CURRENT MEDICAL CONDITIONS:  1.  Chronic renal disease.  2.  Diabetes.  3.  Hypertension.  4.  Hyperlipidemia.  5.  Hypothyroidism. 6.  Prostate cancer, status post radiation therapy in 2005.  7.  Chronic foot drop.  REVIEW OF SYSTEMS: The patient denies chest pain, shortness of breath, coronary artery disease or stroke.   PHYSICAL EXAMINATION:  GENERAL: Elderly white male in no acute distress.  HEENT: Sclerae were clear. Pupils were equally round, reactive to light and accommodation. Extraocular movements are intact.  NECK: Supple. No palpable cervical adenopathy. No audible carotid bruits.  LUNGS: Clear to auscultation.  CARDIOVASCULAR: Regular rhythm and rate without audible murmurs.  ABDOMEN: Soft, nontender abdomen.  GENITOURINARY: Circumcised. Testes smooth and nontender, 20 mL in size each.  RECTAL: Greater than 50 grams, smooth and nontender prostate.  NEUROMUSCULAR: Alert  and oriented x 3.   IMPRESSION:  1.  Urinary retention.  2.  Benign prostatic hypertrophy. 3.  Prostate cancer, status post radiation therapy.   PLAN: Photovaporization of the prostate with greenlight laser.  ____________________________ Otelia Limes. Yves Dill, MD mrw:aw D: 05/06/2013 08:11:08 ET T: 05/06/2013 08:34:48 ET JOB#: [id number]  cc: Otelia Limes. Yves Dill, MD, <Dictator> Royston Cowper MD ELECTRONICALLY SIGNED 05/06/2013 12:31

## 2014-05-22 NOTE — Op Note (Signed)
PATIENT NAME:  Brent Pollard, Brent Pollard MR#:  [id number] DATE OF BIRTH:  August 19, 1926  DATE OF PROCEDURE:  05/11/2013  PREOPERATIVE DIAGNOSES: 1.  Urinary retention.  2.  Prostate cancer.  POSTOPERATIVE DIAGNOSES: 1.  Urinary retention.  2.  Prostate cancer.  PROCEDURE PERFORMED: Photovaporization of prostate with a GreenLight laser.   SURGEON: Maryan Puls, MD.  Idalia NeedleKayleen Memos and surgeon.  ANESTHETIC METHOD:  General per Kathrynn Speed local per Dr. Yves Dill.   INDICATIONS: See the dictated history and physical. After informed consent, the patient requests the above procedure.   OPERATIVE SUMMARY: After adequate general anesthesia had been obtained, the patient was placed into dorsal lithotomy position and the perineum was prepped and draped in the usual fashion. The laser scope was coupled with the camera and then visually advanced into the bladder. The bladder was heavily trabeculated. No bladder mucosal lesions were identified. The patient had trilobar BPH with visual obstruction. Prostate size was estimated to be approximately 80 grams. At this point, the XPS GreenLight laser fiber was introduced through the scope and set at a power setting of 80 watts. Bladder neck tissue and median lobe was vaporized. The power was then increased up to 120 watts and additional tissue from the bladder neck to the mid prostate was vaporized. Finally, power was increased up to 180 watts and remaining obstructive tissue from the bladder neck to the verumontanum was vaporized. At this point, the scope was removed and 10 mL of viscous Xylocaine instilled within the urethra and the bladder. A 54-[id number] silicone catheter was placed. The catheter was then irrigated until clear. B and O suppository was placed. The procedure was then terminated and the patient was transferred to the recovery room in stable condition.    ____________________________ Otelia Limes. Yves Dill, MD mrw:ce D: 05/11/2013 15:56:20  ET T: 05/11/2013 20:15:54 ET JOB#: [id number]  cc: Otelia Limes. Yves Dill, MD, <Dictator> Royston Cowper MD ELECTRONICALLY SIGNED 05/12/2013 9:10

## 2014-05-24 ENCOUNTER — Other Ambulatory Visit: Payer: Self-pay | Admitting: Internal Medicine

## 2014-05-24 DIAGNOSIS — F0280 Dementia in other diseases classified elsewhere without behavioral disturbance: Secondary | ICD-10-CM

## 2014-05-24 DIAGNOSIS — G301 Alzheimer's disease with late onset: Principal | ICD-10-CM

## 2014-06-02 ENCOUNTER — Ambulatory Visit
Admission: RE | Admit: 2014-06-02 | Discharge: 2014-06-02 | Disposition: A | Discharge status: Home / Self Care | Payer: Medicare Other | Source: Ambulatory Visit | Attending: Internal Medicine | Admitting: Internal Medicine

## 2014-06-02 DIAGNOSIS — F4489 Other dissociative and conversion disorders: Secondary | ICD-10-CM | POA: Diagnosis present

## 2014-06-02 DIAGNOSIS — F0280 Dementia in other diseases classified elsewhere without behavioral disturbance: Secondary | ICD-10-CM

## 2014-06-02 DIAGNOSIS — D320 Benign neoplasm of cerebral meninges: Secondary | ICD-10-CM | POA: Diagnosis not present

## 2014-06-02 DIAGNOSIS — G319 Degenerative disease of nervous system, unspecified: Secondary | ICD-10-CM | POA: Diagnosis not present

## 2014-06-02 DIAGNOSIS — G301 Alzheimer's disease with late onset: Secondary | ICD-10-CM

## 2014-06-02 MED ORDER — GADOBENATE DIMEGLUMINE 529 MG/ML IV SOLN
20.0000 mL | Freq: Once | INTRAVENOUS | Status: AC
  Administered 2014-06-02: 20 mL via INTRAVENOUS

## 2014-12-15 ENCOUNTER — Other Ambulatory Visit: Payer: Self-pay | Admitting: Family Medicine

## 2014-12-16 ENCOUNTER — Other Ambulatory Visit: Payer: Self-pay | Admitting: Family Medicine

## 2015-02-26 IMAGING — DX DG CHEST 2V
2 series · 2 of 2 positions shown · non-contrast
Comparison: 07/17/2012

CLINICAL DATA: Right rib pain post fall

EXAM:
CHEST  2 VIEW

[chest pa]
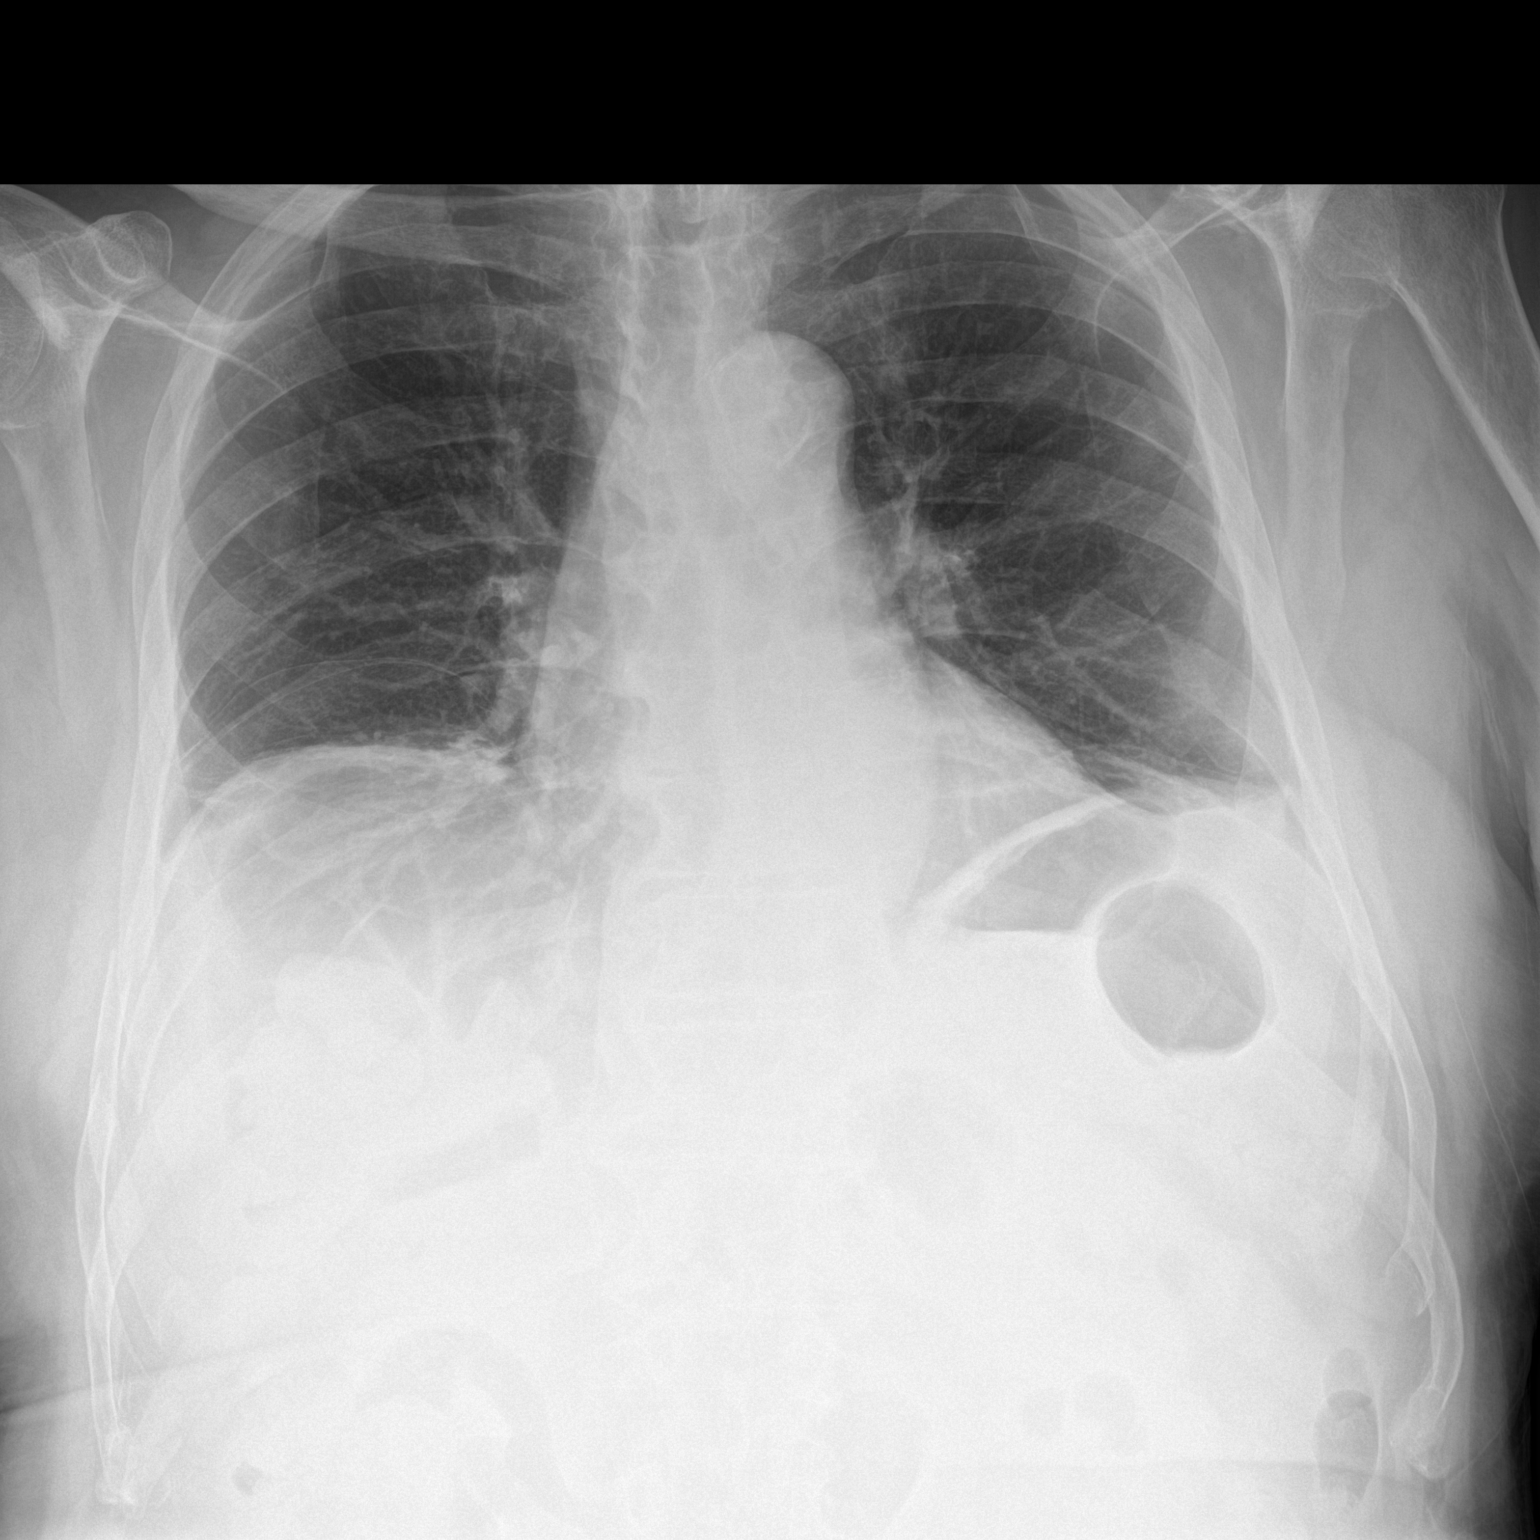

[chest lat]
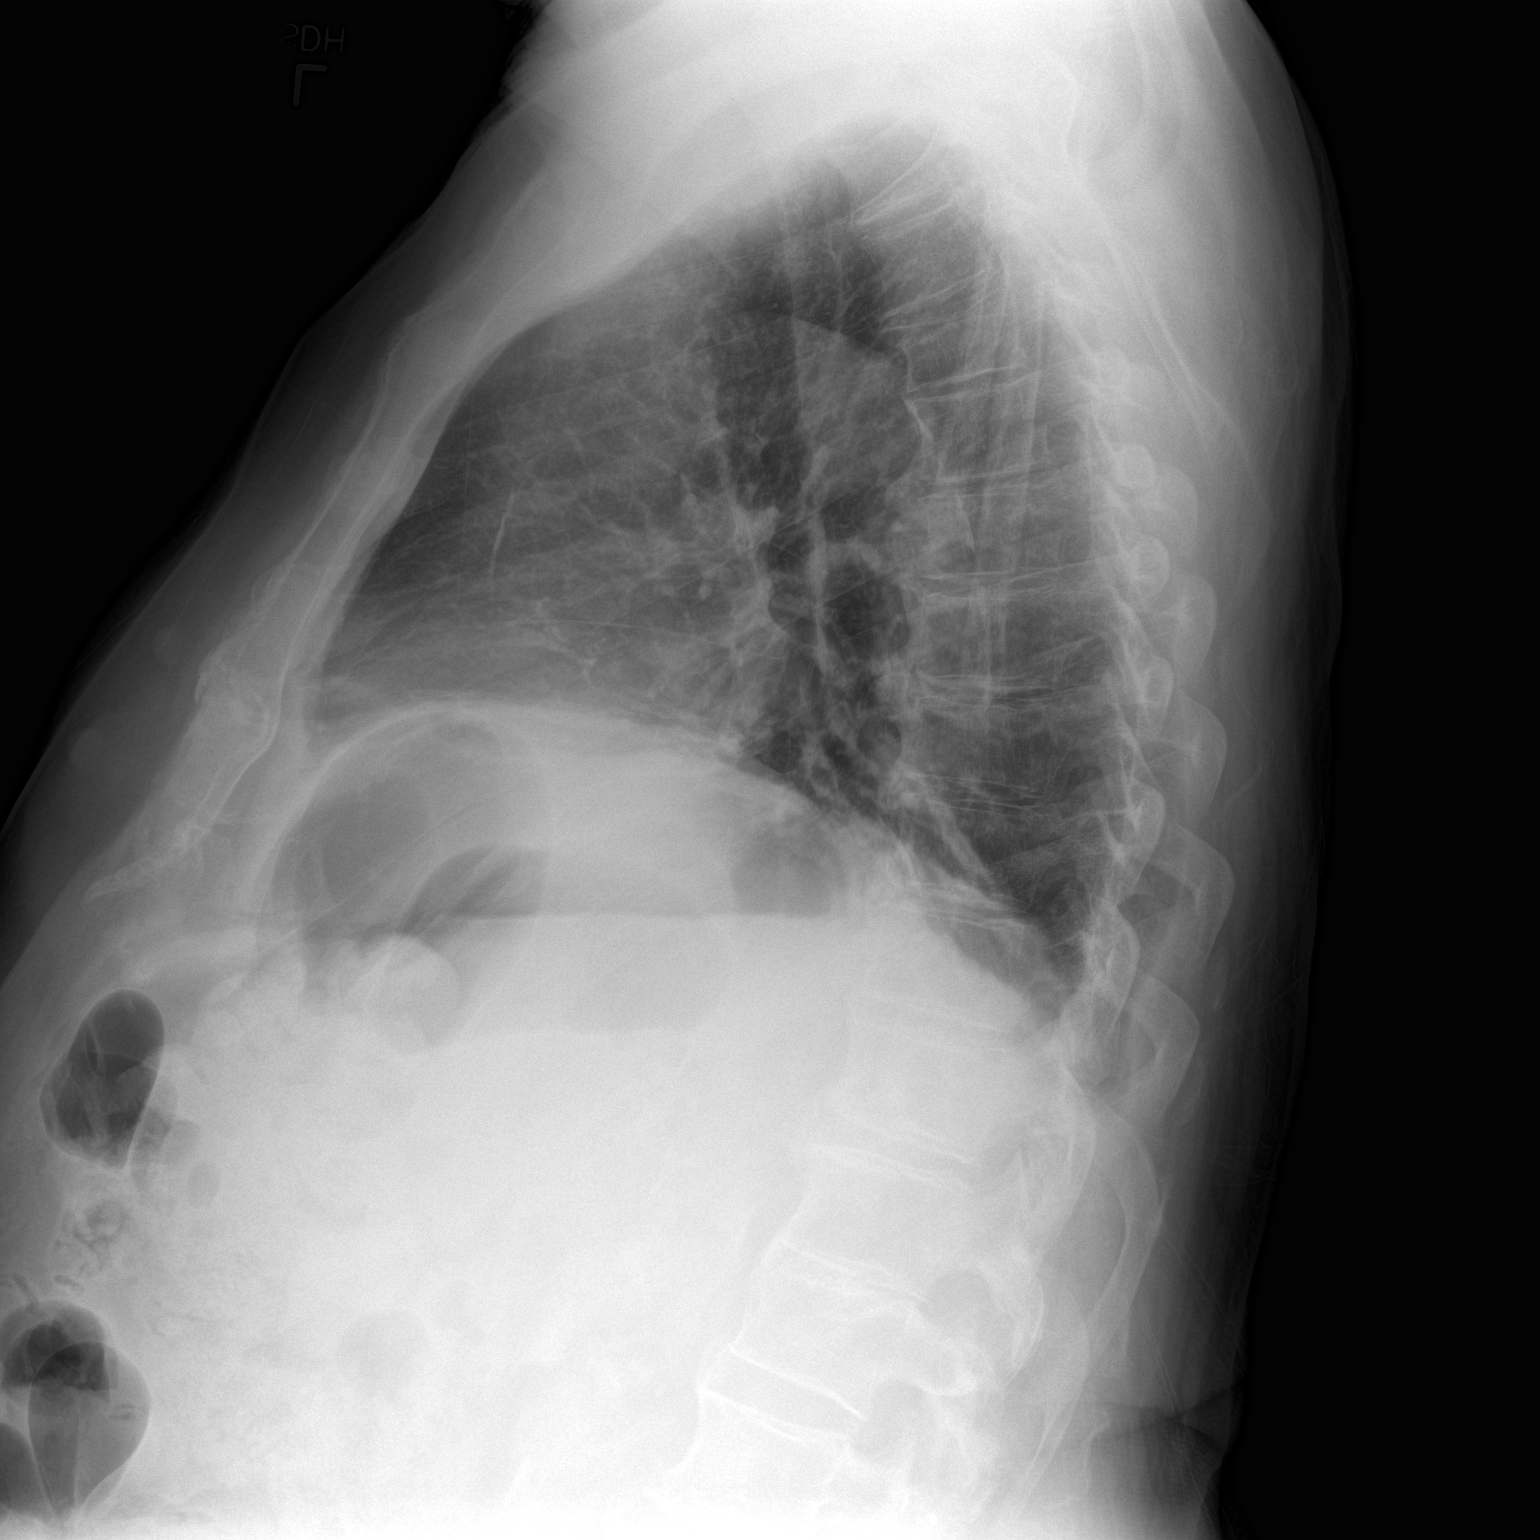

[2 of 2 positions shown; findings below may reference images not displayed]

FINDINGS: Cardiomediastinal silhouette is unremarkable. Mild right basilar
atelectasis. No acute infiltrate or pulmonary edema. Displaced rib
fracture of the right eighth and ninth rib. No pneumothorax.
IMPRESSION: Mild displaced rib fracture of the right eighth and ninth rib. No
pneumothorax. Mild right basilar atelectasis.

## 2015-03-31 ENCOUNTER — Other Ambulatory Visit: Payer: Self-pay | Admitting: Internal Medicine

## 2015-03-31 DIAGNOSIS — R1310 Dysphagia, unspecified: Secondary | ICD-10-CM

## 2015-04-04 ENCOUNTER — Ambulatory Visit: Payer: Medicare Other

## 2015-04-07 ENCOUNTER — Encounter: Payer: Self-pay | Admitting: *Deleted

## 2015-06-07 ENCOUNTER — Emergency Department: Payer: Medicare Other

## 2015-06-07 ENCOUNTER — Emergency Department
Admission: EM | Admit: 2015-06-07 | Discharge: 2015-06-07 | Disposition: A | Discharge status: Home / Self Care | Payer: Medicare Other | Attending: Emergency Medicine | Admitting: Emergency Medicine

## 2015-06-07 ENCOUNTER — Encounter: Payer: Self-pay | Admitting: Emergency Medicine

## 2015-06-07 DIAGNOSIS — E119 Type 2 diabetes mellitus without complications: Secondary | ICD-10-CM | POA: Insufficient documentation

## 2015-06-07 DIAGNOSIS — R29810 Facial weakness: Secondary | ICD-10-CM | POA: Diagnosis not present

## 2015-06-07 DIAGNOSIS — J069 Acute upper respiratory infection, unspecified: Secondary | ICD-10-CM | POA: Diagnosis not present

## 2015-06-07 DIAGNOSIS — E079 Disorder of thyroid, unspecified: Secondary | ICD-10-CM | POA: Insufficient documentation

## 2015-06-07 DIAGNOSIS — N289 Disorder of kidney and ureter, unspecified: Secondary | ICD-10-CM | POA: Insufficient documentation

## 2015-06-07 DIAGNOSIS — Z79899 Other long term (current) drug therapy: Secondary | ICD-10-CM | POA: Diagnosis not present

## 2015-06-07 DIAGNOSIS — R531 Weakness: Secondary | ICD-10-CM

## 2015-06-07 HISTORY — DX: Disorder of kidney and ureter, unspecified: N28.9

## 2015-06-07 HISTORY — DX: Disorder of thyroid, unspecified: E07.9

## 2015-06-07 HISTORY — DX: Unspecified dementia, unspecified severity, without behavioral disturbance, psychotic disturbance, mood disturbance, and anxiety: F03.90

## 2015-06-07 HISTORY — DX: Type 2 diabetes mellitus without complications: E11.9

## 2015-06-07 LAB — DIFFERENTIAL
BASOS ABS: 0 10*3/uL (ref 0–0.1)
BASOS PCT: 1 %
EOS ABS: 0.2 10*3/uL (ref 0–0.7)
Eosinophils Relative: 3 %
Lymphocytes Relative: 15 %
Lymphs Abs: 1 10*3/uL (ref 1.0–3.6)
MONO ABS: 0.9 10*3/uL (ref 0.2–1.0)
Monocytes Relative: 13 %
NEUTROS ABS: 4.7 10*3/uL (ref 1.4–6.5)
Neutrophils Relative %: 68 %

## 2015-06-07 LAB — URINALYSIS COMPLETE WITH MICROSCOPIC (ARMC ONLY)
BACTERIA UA: NONE SEEN
Bilirubin Urine: NEGATIVE
Glucose, UA: >500 mg/dL — AB
KETONES UR: NEGATIVE mg/dL
LEUKOCYTES UA: NEGATIVE
NITRITE: NEGATIVE
PROTEIN: 30 mg/dL — AB
RBC / HPF: NONE SEEN RBC/hpf (ref 0–5)
SPECIFIC GRAVITY, URINE: 1.013 (ref 1.005–1.030)
pH: 5 (ref 5.0–8.0)

## 2015-06-07 LAB — URINE DRUG SCREEN, QUALITATIVE (ARMC ONLY)
AMPHETAMINES, UR SCREEN: NOT DETECTED
Barbiturates, Ur Screen: NOT DETECTED
Benzodiazepine, Ur Scrn: NOT DETECTED
CANNABINOID 50 NG, UR ~~LOC~~: NOT DETECTED
COCAINE METABOLITE, UR ~~LOC~~: NOT DETECTED
MDMA (ECSTASY) UR SCREEN: NOT DETECTED
METHADONE SCREEN, URINE: NOT DETECTED
Opiate, Ur Screen: NOT DETECTED
Phencyclidine (PCP) Ur S: NOT DETECTED
TRICYCLIC, UR SCREEN: POSITIVE — AB

## 2015-06-07 LAB — COMPREHENSIVE METABOLIC PANEL
ALT: 12 U/L — AB (ref 17–63)
ANION GAP: 6 (ref 5–15)
AST: 19 U/L (ref 15–41)
Albumin: 3.5 g/dL (ref 3.5–5.0)
Alkaline Phosphatase: 69 U/L (ref 38–126)
BUN: 20 mg/dL (ref 6–20)
CHLORIDE: 100 mmol/L — AB (ref 101–111)
CO2: 28 mmol/L (ref 22–32)
Calcium: 8.6 mg/dL — ABNORMAL LOW (ref 8.9–10.3)
Creatinine, Ser: 1.61 mg/dL — ABNORMAL HIGH (ref 0.61–1.24)
GFR, EST AFRICAN AMERICAN: 43 mL/min — AB (ref >60)
GFR, EST NON AFRICAN AMERICAN: 37 mL/min — AB (ref >60)
Glucose, Bld: 262 mg/dL — ABNORMAL HIGH (ref 65–99)
POTASSIUM: 4.2 mmol/L (ref 3.5–5.1)
SODIUM: 134 mmol/L — AB (ref 135–145)
Total Bilirubin: 1 mg/dL (ref 0.3–1.2)
Total Protein: 7.2 g/dL (ref 6.5–8.1)

## 2015-06-07 LAB — APTT: APTT: 28 s (ref 24–36)

## 2015-06-07 LAB — CBC
HEMATOCRIT: 41.1 % (ref 40.0–52.0)
Hemoglobin: 13.6 g/dL (ref 13.0–18.0)
MCH: 29.7 pg (ref 26.0–34.0)
MCHC: 33.1 g/dL (ref 32.0–36.0)
MCV: 89.6 fL (ref 80.0–100.0)
PLATELETS: 197 10*3/uL (ref 150–440)
RBC: 4.58 MIL/uL (ref 4.40–5.90)
RDW: 13.3 % (ref 11.5–14.5)
WBC: 6.9 10*3/uL (ref 3.8–10.6)

## 2015-06-07 LAB — TROPONIN I

## 2015-06-07 LAB — PROTIME-INR
INR: 1.03
PROTHROMBIN TIME: 13.7 s (ref 11.4–15.0)

## 2015-06-07 LAB — ETHANOL: Alcohol, Ethyl (B): <5 mg/dL (ref <5)

## 2015-06-07 MED ORDER — DOXYCYCLINE HYCLATE 100 MG PO TABS: 100.0000 mg | ORAL_TABLET | Freq: Two times a day (BID) | ORAL | Status: DC

## 2015-06-07 MED ORDER — DOXYCYCLINE HYCLATE 100 MG PO TABS
100.0000 mg | ORAL_TABLET | Freq: Once | ORAL | Status: AC
  Administered 2015-06-07: 100 mg via ORAL
  Filled 2015-06-07: qty 1

## 2015-06-07 MED ORDER — ALBUTEROL SULFATE HFA 108 (90 BASE) MCG/ACT IN AERS: 2.0000 | INHALATION_SPRAY | Freq: Four times a day (QID) | RESPIRATORY_TRACT | Status: AC | PRN

## 2015-06-07 NOTE — ED Notes (Signed)
Patient transported to MRI 

## 2015-06-07 NOTE — ED Notes (Signed)
Patient transported to CT 

## 2015-06-07 NOTE — ED Notes (Signed)
MRI speaking with pt wife for screening questions.

## 2015-06-07 NOTE — ED Notes (Signed)
Pt presents with weakness since yesterday; cold symptoms since Sunday. Pt sent from [facility] with left-sided weakness and generalized fatigue.

## 2015-06-07 NOTE — ED Provider Notes (Signed)
[facility] Emergency Department Provider Note   ____________________________________________  Time seen: Approximately 2 PM  I have reviewed the triage vital signs and the nursing notes.   HISTORY  Chief Complaint Weakness   HPI Brent Pollard is a 82 y.o. male with a history of diabetes and kidney failure as well as dementia who is presenting to the emergency department today with 1 week of runny nose and cough. He has also begun to feel acutely weak starting yesterday afternoon when he was having difficulty ambulating. He does not use a walker or cane but then needed assistance to ambulate. He was seen in urgent care this morning and sent to the emergency department today with a suspected brainstem stroke syndrome. He has been having difficulty forming words as well as difficulty swallowing for weeks to months at this time. He is refuses while a study is now outpatient with his primary care doctor, Dr. Doy Hutching. He is denying any pain at this time. His family is at the bedside and says when he smiles these are noticing a left-sided facial droop which they say is new.   Past Medical History  Diagnosis Date  . Diabetes mellitus without complication ([place])   . Renal disorder   . Thyroid disease   . Dementia     There are no active problems to display for this patient.   History reviewed. No pertinent past surgical history.  Current Outpatient Rx  Name  Route  Sig  Dispense  Refill  . cyanocobalamin 1000 MCG tablet   Oral   Take 1,000 mcg by mouth daily.         . haloperidol (HALDOL) 0.5 MG tablet   Oral   Take 1 tablet by mouth 3 (three) times daily.         Marland Kitchen levothyroxine (SYNTHROID, LEVOTHROID) 75 MCG tablet   Oral   Take 1 tablet by mouth daily.         Marland Kitchen omeprazole (PRILOSEC) 40 MG capsule   Oral   Take 1 capsule by mouth daily.         . QUEtiapine (SEROQUEL) 25 MG tablet   Oral   Take 1 tablet by mouth 2 (two) times  daily.         . repaglinide (PRANDIN) 0.5 MG tablet   Oral   Take 1 tablet by mouth 3 (three) times daily before meals.          . tamsulosin (FLOMAX) 0.4 MG CAPS capsule   Oral   Take 1 capsule by mouth daily.         Marland Kitchen VICTOZA 18 MG/3ML SOPN   Subcutaneous   Inject 1.2 mg into the skin daily.            Dispense as written.     Allergies Ativan  History reviewed. No pertinent family history.  Social History Social History  Substance Use Topics  . Smoking status: Never Smoker   . Smokeless tobacco: Never Used  . Alcohol Use: No    Review of Systems Constitutional: No fever/chills Eyes: No visual changes. ENT: No sore throat. Cardiovascular: Denies chest pain. Respiratory:Productive cough. Area Gastrointestinal: No abdominal pain.  No nausea, no vomiting.  No diarrhea.  No constipation. Genitourinary: Negative for dysuria. Musculoskeletal: Negative for back pain. Skin: Negative for rash. Neurological: Negative for headaches, numbness.  10-point ROS otherwise negative.  ____________________________________________   PHYSICAL EXAM:  VITAL SIGNS: ED Triage Vitals  Enc Vitals Group  BP 06/07/15 1243 130/96 mmHg     Pulse Rate 06/07/15 1243 68     Resp 06/07/15 1243 18     Temp 06/07/15 1243 97.5 F (36.4 C)     Temp Source 06/07/15 1243 Oral     SpO2 06/07/15 1243 97 %     Weight 06/07/15 1243 226 lb (102.513 kg)     Height 06/07/15 1243 6\' 2"  (1.88 m)     Head Cir --      [facility] Flow --      Pain Score --      Pain Loc --      Pain Edu? --      Excl. in [place]? --     Constitutional: Alert and oriented. Well appearing and in no acute distress. Eyes: Conjunctivae are normal. PERRL. EOMI. Head: Atraumatic. Nose: Rhinorrhea to the bilateral naris Mouth/Throat: Mucous membranes are moist.  Oropharynx non-erythematous. Neck: No stridor.   Cardiovascular: Normal rate, regular rhythm. Grossly normal heart sounds.  Good peripheral  circulation. Respiratory: Normal respiratory effort.  No retractions. 3 mild wheezing to the right middle and lower fields. Gastrointestinal: Soft and nontender. No distention. No abdominal bruits. No CVA tenderness. Musculoskeletal: No lower extremity tenderness nor edema.  No joint effusions. Neurologic:  Mildly garbled speech which the family says this is baseline. Mild left-sided facial droop.  Skin:  Skin is warm, dry and intact. No rash noted. Psychiatric: Mood and affect are normal.   NIH Stroke Scale    Time: 205pm Person Administering Scale: Doran Stabler  Administer stroke scale items in the order listed. Record performance in each category after each subscale exam. Do not go back and change scores. Follow directions provided for each exam technique. Scores should reflect what the patient does, not what the clinician thinks the patient can do. The clinician should record answers while administering the exam and work quickly. Except where indicated, the patient should not be coached (i.e., repeated requests to patient to make a special effort).   1a  Level of consciousness: 0=alert; keenly responsive  1b. LOC questions:  0=Performs both tasks correctly  1c. LOC commands: 0=Performs both tasks correctly  2.  Best Gaze: 0=normal  3.  Visual: 0=No visual loss  4. Facial Palsy: 1=Minor paralysis (flattened nasolabial fold, asymmetric on smiling)  5a.  Motor left arm: 0=No drift, limb holds 90 (or 45) degrees for full 10 seconds  5b.  Motor right arm: 0=No drift, limb holds 90 (or 45) degrees for full 10 seconds  6a. motor left leg: 0=No drift, limb holds 90 (or 45) degrees for full 10 seconds  6b  Motor right leg:  0=No drift, limb holds 90 (or 45) degrees for full 10 seconds  7. Limb Ataxia: 0=Absent  8.  Sensory: 0=Normal; no sensory loss  9. Best Language:  0  10. Dysarthria: 1=Mild to moderate, patient slurs at least some words and at worst, can be understood with some  difficulty  11. Extinction and Inattention: 0=No abnormality  12. Distal motor function: 0=Normal   Total:   2   ____________________________________________   LABS (all labs ordered are listed, but only abnormal results are displayed)  Labs Reviewed  COMPREHENSIVE METABOLIC PANEL - Abnormal; Notable for the following:    Sodium 134 (*)    Chloride 100 (*)    Glucose, Bld 262 (*)    Creatinine, Ser 1.61 (*)    Calcium 8.6 (*)    ALT 12 (*)  GFR calc non Af Amer 37 (*)    GFR calc Af Amer 43 (*)    All other components within normal limits  URINE DRUG SCREEN, QUALITATIVE ([facility] ONLY) - Abnormal; Notable for the following:    Tricyclic, Ur Screen POSITIVE (*)    All other components within normal limits  URINALYSIS COMPLETEWITH MICROSCOPIC ([facility] ONLY) - Abnormal; Notable for the following:    Color, Urine YELLOW (*)    APPearance CLEAR (*)    Glucose, UA >500 (*)    Hgb urine dipstick 1+ (*)    Protein, ur 30 (*)    Squamous Epithelial / LPF 0-5 (*)    All other components within normal limits  ETHANOL  PROTIME-INR  APTT  CBC  DIFFERENTIAL  TROPONIN I   ____________________________________________  EKG  ED ECG REPORT I, Doran Stabler, the attending physician, personally viewed and interpreted this ECG.   Date: 06/07/2015  EKG Time: 1255  Rate: 62  Rhythm: normal sinus rhythm with PACs  Axis: Normal axis  Intervals:first-degree A-V block   ST&T Change: No ST segment elevation or depression. No abnormal T-wave inversions.  ____________________________________________  RADIOLOGY  CT Head Wo Contrast (Final result) Result time: 06/07/15 13:28:48   Final result by Rad Results In Interface (06/07/15 13:28:48)   Narrative:   CLINICAL DATA: Left-sided weakness and altered mental status  EXAM: CT HEAD WITHOUT CONTRAST  TECHNIQUE: Contiguous axial images were obtained from the base of the skull through the vertex without intravenous  contrast.  COMPARISON: Brain MRI Jun 02, 2014  FINDINGS: Mild diffuse atrophy is stable. There is no intracranial mass, hemorrhage, extra-axial fluid collection, or midline shift. There is small vessel disease in the centra semiovale bilaterally. There is also mild small vessel disease in the mid pons in the basilar perforator distribution. No acute infarct evident. The bony calvarium appears intact. The visualized mastoid air cells are clear. Visualized orbits are symmetric bilaterally.  IMPRESSION: Atrophy with patchy periventricular small vessel disease. No intracranial mass, hemorrhage, or evidence of acute infarct.   Electronically Signed By: Lowella Grip III M.D. On: 06/07/2015 13:28         MR Brain Wo Contrast (Final result) Result time: 06/07/15 15:22:05   Final result by Rad Results In Interface (06/07/15 15:22:05)   Narrative:   CLINICAL DATA: 82 year old male with weakness since yesterday. Left side weakness in generalized fatigue. Cold symptoms for 3 days. Initial encounter.  EXAM: MRI HEAD WITHOUT CONTRAST  TECHNIQUE: Multiplanar, multiecho pulse sequences of the brain and surrounding structures were obtained without intravenous contrast.  COMPARISON: Head CT without contrast 1323 hours today. Brain MRI 06/02/2014.  FINDINGS: Major intracranial vascular flow voids are stable. Cerebral volume is stable since 2016. No restricted diffusion or evidence of acute infarction. Stable mild ventricular prominence with no enlargement of the temporal horns are fourth ventricle. No midline shift, mass effect, extra-axial collection or acute intracranial hemorrhage. Cervicomedullary junction and pituitary are within normal limits.  Small 6 mm right tentorial probable meningioma re- demonstrated and appears stable (series 8, image 11). Stable gray and white matter signal, patchy nonspecific cerebral white matter T2 and FLAIR hyperintensity. Mild  patchy T2 hyperintensity in the pons. No cortical encephalomalacia. Deep gray matter nuclei and cerebellum remain normal for age.  Grossly stable and normal visualized internal auditory structures. Mastoids remain clear. Small fluid level today in the right maxillary sinus with mild ethmoid mucosal thickening. Negative orbit and scalp soft tissues.  Visible cervical spine appears  stable on sagittal imaging. However, a 2 cm cystic lesion has developed anterior to the right longus coli muscle at the C1-C2 level (series 7, image 1). This has a benign appearance and is most likely degenerative.  IMPRESSION: 1. No acute intracranial abnormality. Noncontrast MRI appearance of the brain appears stable since 2016. 2. New mild paranasal sinus inflammation. 3. New 2 cm probably degenerative cyst anterior to the cervical spine at the right C1-C2 level.   Electronically Signed By: Genevie Ann M.D. On: 06/07/2015 15:22          DG Chest 2 View (Final result) Result time: 06/07/15 14:25:22   Final result by Rad Results In Interface (06/07/15 14:25:22)   Narrative:   CLINICAL DATA: URI symptoms and fatigue for the past week, new onset of generalized weakness since yesterday afternoon and is unable to walk without assistance which is a change from baseline ; possible CVA.  EXAM: CHEST 2 VIEW  COMPARISON: PA and lateral chest x-ray of September 20, 2013  FINDINGS: The lungs are hypoinflated. There is stable increased density at the lung bases. There is no alveolar infiltrate. There is no pleural effusion. The heart and pulmonary vascularity are normal. The mediastinum is normal in width. There is calcification of portions of the anterior longitudinal ligament of the thoracic spine.  IMPRESSION: Bilateral hypoinflation. Stable scarring at the lung bases. No evidence of pneumonia nor CHF.   Electronically Signed By: Aswad Wandrey [place] M.D. On: 06/07/2015 14:25         ____________________________________________   PROCEDURES    ____________________________________________   INITIAL IMPRESSION / ASSESSMENT AND PLAN / ED COURSE  Pertinent labs & imaging results that were available during my care of the patient were reviewed by me and considered in my medical decision making (see chart for details).  Patient is out of the window for TPA because his symptoms have been going on since at least yesterday afternoon. However, we will proceed with MRI because of recent onset weakness as well as left-sided facial droop. Also suspect possible pneumonia given cough and runny nose over the past week and right-sided wheezing. The patient is not a smoker and has no history of pneumonia.  ----------------------------------------- 5:16 PM on 06/07/2015 -----------------------------------------  Discussed the case with Dr. Doy Mince of the neurology service. I discussed with her the left facial droop. She says that it is possible that he is having a small stroke not picked up on MRI. She recommends daily aspirin. I did discuss this with the patient the family who says he is not supposed to be on aspirin secondary to his nephrologist's request because of his decreased kidney function. I also measured reexamined to make sure this appeared as a central droop. He is able to fully range his forehead. He doesn't a history of Bell's palsy but this does not appear in the distribution of where Bell's palsy would be expected to be. He'll be following up with his primary care doctor. I'll also be discharging with a prescription for azithromycin as well as an inhaler for possible early pneumonia. He did have a focal wheeze to the right side. It is also possible that he is deconditioned from an early pneumonia and an upper respirator infection. He has been feeling ill all week from this and things acutely worsened yesterday. The family is also aware of need for follow-up in the  office for likely further stroke workup with ultrasound of his carotids as well as heart. Will be discharged home. Patient able to  ambulate with a normal gait without assistance. Appearing better at this time. ____________________________________________   FINAL CLINICAL IMPRESSION(S) / ED DIAGNOSES  Left-sided facial droop. Upper respiratory infection with possible early pneumonia. Weakness.    NEW MEDICATIONS STARTED DURING THIS VISIT:  New Prescriptions   No medications on file     Note:  This document was prepared using Dragon voice recognition software and may include unintentional dictation errors.    Orbie Pyo, MD 06/07/15 [phone]

## 2015-06-07 NOTE — ED Notes (Signed)
Patient transported to X-ray 

## 2015-06-07 NOTE — ED Notes (Signed)
Pt in via triage; sent over from PCP to rule out possible stroke.  Pt to PCP today due to cold symptoms, fatigue x 1 week.  Pt with complaints of new onset generalized weakness since yesterday afternoon per the family; unable to ambulate without assistance which is off from pt baseline.  Pt A/Ox3, hx of advanced dementia.  Family at bedside.

## 2015-06-14 ENCOUNTER — Other Ambulatory Visit: Payer: Self-pay | Admitting: Internal Medicine

## 2015-06-14 DIAGNOSIS — R1310 Dysphagia, unspecified: Secondary | ICD-10-CM

## 2015-07-04 ENCOUNTER — Ambulatory Visit: Payer: Medicare Other | Attending: Internal Medicine | Admitting: Speech Pathology

## 2015-07-07 ENCOUNTER — Ambulatory Visit: Payer: Medicare Other | Admitting: Speech Pathology

## 2015-07-11 ENCOUNTER — Ambulatory Visit: Payer: Medicare Other | Admitting: Speech Pathology

## 2015-07-13 ENCOUNTER — Ambulatory Visit: Payer: Medicare Other | Admitting: Speech Pathology

## 2015-07-15 ENCOUNTER — Ambulatory Visit
Admission: RE | Admit: 2015-07-15 | Discharge: 2015-07-15 | Disposition: A | Discharge status: Home / Self Care | Payer: Medicare Other | Source: Ambulatory Visit | Attending: Internal Medicine | Admitting: Internal Medicine

## 2015-07-15 DIAGNOSIS — F0390 Unspecified dementia without behavioral disturbance: Secondary | ICD-10-CM | POA: Diagnosis not present

## 2015-07-15 DIAGNOSIS — E119 Type 2 diabetes mellitus without complications: Secondary | ICD-10-CM | POA: Insufficient documentation

## 2015-07-15 DIAGNOSIS — R1310 Dysphagia, unspecified: Secondary | ICD-10-CM | POA: Diagnosis not present

## 2015-07-15 DIAGNOSIS — E079 Disorder of thyroid, unspecified: Secondary | ICD-10-CM | POA: Diagnosis not present

## 2015-07-15 NOTE — Therapy (Signed)
[place], [place], [address] Phone: [phone]   Fax:     Modified Barium Swallow  Patient Details  Name: Brent Pollard MRN: [id number] Date of Birth: 08-16-1926 No Data Recorded  Encounter Date: 07/15/2015      End of Session - 07/15/15 1340    Visit Number 1   Number of Visits 1   Date for SLP Re-Evaluation 07/15/15   SLP Start Time 43   SLP Stop Time  1330   SLP Time Calculation (min) 60 min   Activity Tolerance Patient tolerated treatment well      Past Medical History  Diagnosis Date  . Diabetes mellitus without complication ([place])   . Renal disorder   . Thyroid disease   . Dementia     No past surgical history on file.  There were no vitals filed for this visit.   Subjective: Patient behavior: (alertness, ability to follow instructions, etc.):  The patient is able to follow simple directions  Chief complaint: Patient denies any difficulty swallowing.  His granddaughter reports some choking/strangling with meals.    Objective:  Radiological Procedure: A videoflouroscopic evaluation of oral-preparatory, reflex initiation, and pharyngeal phases of the swallow was performed; as well as a screening of the upper esophageal phase.  I. POSTURE: Upright in MBS chair  II. VIEW: Lateral  III. COMPENSATORY STRATEGIES: Small sips thin liquid- prevents aspiration  IV. BOLUSES ADMINISTERED:   Thin Liquid: 2 small sips, 3 rapid, consecutive sips   Nectar-thick Liquid: 1 medium size sip    Puree: 2 teaspoon presentations   Mechanical Soft: 1/4 graham cracker in applesauce  V. RESULTS OF EVALUATION: A. ORAL PREPARATORY PHASE: (The lips, tongue, and velum are observed for strength and coordination)       **Overall Severity Rating: Within normal limits  B. SWALLOW INITIATION/REFLEX: (The reflex is normal if "triggered" by the time the bolus reached the base of the  tongue)  **Overall Severity Rating: Mild-moderate; triggers while falling from the valleculae to the pyriform sinuses with thin liquid  C. PHARYNGEAL PHASE: (Pharyngeal function is normal if the bolus shows rapid, smooth, and continuous transit through the pharynx and there is no pharyngeal residue after the swallow)  **Overall Severity Rating: Mild; decreased tongue base retraction, incomplete epiglottic inversion.  Minimal-mild vallecular residue  D. LARYNGEAL PENETRATION: (Material entering into the laryngeal inlet/vestibule but not aspirated) 1X during rapid, consecutive sips  E. ASPIRATION: Silent X1 before 3rd of 3 rapid consecutive swips  F. ESOPHAGEAL PHASE: (Screening of the upper esophagus) In the cervical esophagus there is a finger-like protrusion along the posterior wall during swallow (does not impede flow of boluses) consistent with prominent cricopharyngeus.    ASSESSMENT: This 82 year old man with dementia is presenting with mild oropharyngeal dysphagia characterized delayed pharyngeal swallow initiation, decreased tongue base retraction, and incomplete epiglottic inversion with mild vallecular residue.  There is no observed laryngeal penetration or aspiration of solid, nectar-thick liquid, or small sips of thin liquid.  With rapid gulping of thin liquid (3 rapid consecutive sips) there was one episode of transient laryngeal penetration, one swallow with no penetration/aspiration, and one silent aspiration before the swallow.  In the cervical esophagus there is a finger-like protrusion along the posterior wall during swallow (does not impede flow of boluses) consistent with prominent cricopharyngeus.  The patient is at risk for aspiration of thin liquid when gulping, but it appears that small, single sips prevents aspiration.  At this time, I recommend that the patient continue his current diet, limiting liquid sips to small, single sips.  This was discussed with the patient's  granddaughter immediately following the study.  In view of the progressive nature of dementia, the patient is at risk for deterioration of swallowing and communication.  PLAN/RECOMMENDATIONS:   A. Diet:Regular, small single sips liquid   B. Swallowing Precautions: small, single sips liquid; stringent oral care after meals; reflux precautions; monitor for increase in signs / symptoms of aspiration   C. Recommended consultation to: per MD   D. Therapy recommendations: N/A   E. Results and recommendations were discussed with the patient's granddaughter immediately following the study and the final report routed to the referring MD     Dysphagia - Plan: DG OP Swallowing Func-Medicare/Speech Path, DG OP Swallowing Func-Medicare/Speech Path      G-Codes - August 02, 2015 1340    Functional Assessment Tool Used MBS, clinical judgment   Functional Limitations Swallowing   Swallow Current Status [id number]) At least 20 percent but less than 40 percent impaired, limited or restricted   Swallow Goal Status [id number]) At least 20 percent but less than 40 percent impaired, limited or restricted   Swallow Discharge Status [phone]) At least 20 percent but less than 40 percent impaired, limited or restricted          Problem List There are no active problems to display for this patient.  Leroy Sea, MS/CCC- SLP  Lou Miner 02-Aug-2015, 1:41 PM  [facility] [place], [place], [address] Phone: [phone]   Fax:     Name: Brent Pollard MRN: [id number] Date of Birth: [birth year]

## 2015-07-18 ENCOUNTER — Ambulatory Visit: Payer: Medicare Other | Admitting: Speech Pathology

## 2015-07-25 ENCOUNTER — Ambulatory Visit: Payer: Medicare Other | Admitting: Speech Pathology

## 2015-07-28 ENCOUNTER — Ambulatory Visit: Payer: Medicare Other | Admitting: Speech Pathology

## 2015-10-05 ENCOUNTER — Other Ambulatory Visit: Payer: Self-pay | Admitting: Internal Medicine

## 2015-10-05 DIAGNOSIS — G301 Alzheimer's disease with late onset: Principal | ICD-10-CM

## 2015-10-05 DIAGNOSIS — F0281 Dementia in other diseases classified elsewhere with behavioral disturbance: Secondary | ICD-10-CM

## 2015-10-25 ENCOUNTER — Ambulatory Visit: Admission: RE | Admit: 2015-10-25 | Payer: Medicare Other | Source: Ambulatory Visit

## 2015-12-15 ENCOUNTER — Encounter: Payer: Self-pay | Admitting: *Deleted

## 2015-12-15 NOTE — Patient Outreach (Signed)
[place] [facility]) Care Management Post-Acute Care Coordination  12/15/2015  Brent Pollard [birth year] [id number]   Met with [name] for [facility]. Reviewed patient case. He confirms that patient will go home with wife as caregiver. Wife and patient not in room to visit today.   RNCM will follow up with SW for any anticipated discharge needs  Royetta Crochet. Laymond Purser, RN, BSN, [place] Post-Acute Care Coordinator [phone]

## 2016-01-12 ENCOUNTER — Other Ambulatory Visit: Payer: Self-pay | Admitting: *Deleted

## 2016-01-12 NOTE — Patient Outreach (Signed)
[facility] Care Management Post-Acute Care Coordination  01/12/2016  Jax Kentner Mcelwain 1926/08/24 [id number]   Met with Dimple Nanas, LCSW for [facility]. Reviewed patient case. He states that patient family is working  on possible plan for patient to remain at facility and there are no plans to discharge at this time.  RNCM will sign off case, if patient discharges SW will coordinate with this RNCM if [facility] program needs arise.  Royetta Crochet. Laymond Purser, RN, BSN, [place] Post-Acute Care Coordinator [phone]

## 2016-03-17 ENCOUNTER — Encounter: Payer: Self-pay | Admitting: Emergency Medicine

## 2016-03-17 ENCOUNTER — Inpatient Hospital Stay
Admission: EM | Admit: 2016-03-17 | Discharge: 2016-03-21 | DRG: 872 | Disposition: A | Discharge status: Hospice | Attending: Internal Medicine | Admitting: Internal Medicine

## 2016-03-17 ENCOUNTER — Emergency Department

## 2016-03-17 DIAGNOSIS — Z66 Do not resuscitate: Secondary | ICD-10-CM | POA: Diagnosis present

## 2016-03-17 DIAGNOSIS — N182 Chronic kidney disease, stage 2 (mild): Secondary | ICD-10-CM | POA: Diagnosis present

## 2016-03-17 DIAGNOSIS — K529 Noninfective gastroenteritis and colitis, unspecified: Secondary | ICD-10-CM | POA: Diagnosis present

## 2016-03-17 DIAGNOSIS — A419 Sepsis, unspecified organism: Principal | ICD-10-CM | POA: Diagnosis present

## 2016-03-17 DIAGNOSIS — R197 Diarrhea, unspecified: Secondary | ICD-10-CM

## 2016-03-17 DIAGNOSIS — E872 Acidosis, unspecified: Secondary | ICD-10-CM

## 2016-03-17 DIAGNOSIS — F0390 Unspecified dementia without behavioral disturbance: Secondary | ICD-10-CM | POA: Diagnosis present

## 2016-03-17 DIAGNOSIS — E1122 Type 2 diabetes mellitus with diabetic chronic kidney disease: Secondary | ICD-10-CM | POA: Diagnosis present

## 2016-03-17 DIAGNOSIS — N400 Enlarged prostate without lower urinary tract symptoms: Secondary | ICD-10-CM | POA: Diagnosis present

## 2016-03-17 DIAGNOSIS — R112 Nausea with vomiting, unspecified: Secondary | ICD-10-CM

## 2016-03-17 DIAGNOSIS — E039 Hypothyroidism, unspecified: Secondary | ICD-10-CM | POA: Diagnosis present

## 2016-03-17 LAB — COMPREHENSIVE METABOLIC PANEL
ALT: 14 U/L — ABNORMAL LOW (ref 17–63)
AST: 25 U/L (ref 15–41)
Albumin: 3.6 g/dL (ref 3.5–5.0)
Alkaline Phosphatase: 99 U/L (ref 38–126)
Anion gap: 9 (ref 5–15)
BILIRUBIN TOTAL: 0.8 mg/dL (ref 0.3–1.2)
BUN: 20 mg/dL (ref 6–20)
CHLORIDE: 102 mmol/L (ref 101–111)
CO2: 25 mmol/L (ref 22–32)
CREATININE: 1.44 mg/dL — AB (ref 0.61–1.24)
Calcium: 8.7 mg/dL — ABNORMAL LOW (ref 8.9–10.3)
GFR, EST AFRICAN AMERICAN: 49 mL/min — AB (ref >60)
GFR, EST NON AFRICAN AMERICAN: 42 mL/min — AB (ref >60)
Glucose, Bld: 210 mg/dL — ABNORMAL HIGH (ref 65–99)
POTASSIUM: 4.6 mmol/L (ref 3.5–5.1)
Sodium: 136 mmol/L (ref 135–145)
TOTAL PROTEIN: 7.7 g/dL (ref 6.5–8.1)

## 2016-03-17 LAB — CBC
HEMATOCRIT: 41.3 % (ref 40.0–52.0)
Hemoglobin: 13.6 g/dL (ref 13.0–18.0)
MCH: 27.1 pg (ref 26.0–34.0)
MCHC: 32.9 g/dL (ref 32.0–36.0)
MCV: 82.3 fL (ref 80.0–100.0)
PLATELETS: 265 10*3/uL (ref 150–440)
RBC: 5.01 MIL/uL (ref 4.40–5.90)
RDW: 17.4 % — AB (ref 11.5–14.5)
WBC: 16.6 10*3/uL — ABNORMAL HIGH (ref 3.8–10.6)

## 2016-03-17 LAB — C DIFFICILE QUICK SCREEN W PCR REFLEX
C DIFFICILE (CDIFF) TOXIN: NEGATIVE
C Diff antigen: NEGATIVE
C Diff interpretation: NOT DETECTED

## 2016-03-17 LAB — INFLUENZA PANEL BY PCR (TYPE A & B)
INFLAPCR: NEGATIVE
Influenza B By PCR: NEGATIVE

## 2016-03-17 LAB — GLUCOSE, CAPILLARY: Glucose-Capillary: 138 mg/dL — ABNORMAL HIGH (ref 65–99)

## 2016-03-17 LAB — LACTIC ACID, PLASMA: Lactic Acid, Venous: 2.5 mmol/L (ref 0.5–1.9)

## 2016-03-17 MED ORDER — INSULIN ASPART 100 UNIT/ML ~~LOC~~ SOLN: 0.0000 [IU] | Freq: Every day | SUBCUTANEOUS | Status: DC

## 2016-03-17 MED ORDER — QUETIAPINE FUMARATE 25 MG PO TABS: 25.0000 mg | ORAL_TABLET | Freq: Every day | ORAL | Status: DC

## 2016-03-17 MED ORDER — ORAL CARE MOUTH RINSE
15.0000 mL | Freq: Two times a day (BID) | OROMUCOSAL | Status: DC
  Administered 2016-03-18 – 2016-03-21 (×6): 15 mL via OROMUCOSAL

## 2016-03-17 MED ORDER — SODIUM CHLORIDE 0.9 % IV BOLUS (SEPSIS)
1000.0000 mL | Freq: Once | INTRAVENOUS | Status: AC
  Administered 2016-03-17: 1000 mL via INTRAVENOUS

## 2016-03-17 MED ORDER — SODIUM CHLORIDE 0.9 % IV SOLN
1.5000 g | Freq: Four times a day (QID) | INTRAVENOUS | Status: DC
  Administered 2016-03-17 – 2016-03-21 (×15): 1.5 g via INTRAVENOUS
  Filled 2016-03-17 (×17): qty 1.5

## 2016-03-17 MED ORDER — TAMSULOSIN HCL 0.4 MG PO CAPS
0.4000 mg | ORAL_CAPSULE | Freq: Every day | ORAL | Status: DC
  Administered 2016-03-18 – 2016-03-21 (×4): 0.4 mg via ORAL
  Filled 2016-03-17 (×4): qty 1

## 2016-03-17 MED ORDER — METRONIDAZOLE IN NACL 5-0.79 MG/ML-% IV SOLN
500.0000 mg | Freq: Once | INTRAVENOUS | Status: AC
  Administered 2016-03-17: 500 mg via INTRAVENOUS
  Filled 2016-03-17: qty 100

## 2016-03-17 MED ORDER — ONDANSETRON HCL 4 MG PO TABS: 4.0000 mg | ORAL_TABLET | Freq: Four times a day (QID) | ORAL | Status: DC | PRN

## 2016-03-17 MED ORDER — CIPROFLOXACIN IN D5W 400 MG/200ML IV SOLN
400.0000 mg | Freq: Two times a day (BID) | INTRAVENOUS | Status: DC
  Administered 2016-03-17 – 2016-03-18 (×2): 400 mg via INTRAVENOUS
  Filled 2016-03-17 (×2): qty 200

## 2016-03-17 MED ORDER — TRAZODONE HCL 50 MG PO TABS
25.0000 mg | ORAL_TABLET | Freq: Every evening | ORAL | Status: DC | PRN
  Administered 2016-03-18: 25 mg via ORAL
  Filled 2016-03-17: qty 2

## 2016-03-17 MED ORDER — SODIUM CHLORIDE 0.9 % IV SOLN
INTRAVENOUS | Status: DC
  Administered 2016-03-17 – 2016-03-21 (×8): via INTRAVENOUS

## 2016-03-17 MED ORDER — INSULIN ASPART 100 UNIT/ML ~~LOC~~ SOLN
0.0000 [IU] | Freq: Three times a day (TID) | SUBCUTANEOUS | Status: DC
  Administered 2016-03-18: 4 [IU] via SUBCUTANEOUS
  Administered 2016-03-18 – 2016-03-20 (×6): 3 [IU] via SUBCUTANEOUS
  Administered 2016-03-21: 100 [IU] via SUBCUTANEOUS
  Administered 2016-03-21: 4 [IU] via SUBCUTANEOUS
  Filled 2016-03-17 (×7): qty 3
  Filled 2016-03-17 (×2): qty 4

## 2016-03-17 MED ORDER — SODIUM CHLORIDE 0.9 % IV SOLN
Freq: Once | INTRAVENOUS | Status: AC
  Administered 2016-03-17: 19:00:00 via INTRAVENOUS

## 2016-03-17 MED ORDER — PANTOPRAZOLE SODIUM 40 MG PO TBEC
40.0000 mg | DELAYED_RELEASE_TABLET | Freq: Every day | ORAL | Status: DC
  Administered 2016-03-18 – 2016-03-21 (×4): 40 mg via ORAL
  Filled 2016-03-17 (×4): qty 1

## 2016-03-17 MED ORDER — ONDANSETRON HCL 4 MG/2ML IJ SOLN: 4.0000 mg | Freq: Four times a day (QID) | INTRAMUSCULAR | Status: DC | PRN

## 2016-03-17 MED ORDER — ALBUTEROL SULFATE (2.5 MG/3ML) 0.083% IN NEBU: 3.0000 mL | INHALATION_SOLUTION | Freq: Four times a day (QID) | RESPIRATORY_TRACT | Status: DC | PRN

## 2016-03-17 MED ORDER — ACETAMINOPHEN 325 MG PO TABS: 650.0000 mg | ORAL_TABLET | Freq: Four times a day (QID) | ORAL | Status: DC | PRN

## 2016-03-17 MED ORDER — QUETIAPINE FUMARATE 100 MG PO TABS: 100.0000 mg | ORAL_TABLET | Freq: Every day | ORAL | Status: DC

## 2016-03-17 MED ORDER — QUETIAPINE FUMARATE 100 MG PO TABS
100.0000 mg | ORAL_TABLET | Freq: Every day | ORAL | Status: DC
  Administered 2016-03-17 – 2016-03-20 (×4): 100 mg via ORAL
  Filled 2016-03-17 (×4): qty 1

## 2016-03-17 MED ORDER — LEVOTHYROXINE SODIUM 75 MCG PO TABS
75.0000 ug | ORAL_TABLET | Freq: Every day | ORAL | Status: DC
  Administered 2016-03-18 – 2016-03-21 (×4): 75 ug via ORAL
  Filled 2016-03-17 (×4): qty 1

## 2016-03-17 MED ORDER — CIPROFLOXACIN IN D5W 400 MG/200ML IV SOLN
400.0000 mg | Freq: Once | INTRAVENOUS | Status: DC
  Filled 2016-03-17: qty 200

## 2016-03-17 MED ORDER — QUETIAPINE FUMARATE 25 MG PO TABS: 50.0000 mg | ORAL_TABLET | Freq: Every day | ORAL | Status: DC

## 2016-03-17 MED ORDER — MIRTAZAPINE 15 MG PO TABS
7.5000 mg | ORAL_TABLET | Freq: Every day | ORAL | Status: DC
  Administered 2016-03-17 – 2016-03-20 (×4): 7.5 mg via ORAL
  Filled 2016-03-17: qty 1
  Filled 2016-03-17: qty 2
  Filled 2016-03-17 (×2): qty 1

## 2016-03-17 MED ORDER — ACETAMINOPHEN 650 MG RE SUPP: 650.0000 mg | Freq: Four times a day (QID) | RECTAL | Status: DC | PRN

## 2016-03-17 MED ORDER — HEPARIN SODIUM (PORCINE) 5000 UNIT/ML IJ SOLN
5000.0000 [IU] | Freq: Three times a day (TID) | INTRAMUSCULAR | Status: DC
  Administered 2016-03-17 – 2016-03-19 (×6): 5000 [IU] via SUBCUTANEOUS
  Filled 2016-03-17 (×6): qty 1

## 2016-03-17 MED ORDER — QUETIAPINE FUMARATE 25 MG PO TABS: 25.0000 mg | ORAL_TABLET | Freq: Two times a day (BID) | ORAL | Status: DC

## 2016-03-17 MED ORDER — ONDANSETRON HCL 4 MG/2ML IJ SOLN
4.0000 mg | Freq: Once | INTRAMUSCULAR | Status: AC
  Administered 2016-03-17: 4 mg via INTRAVENOUS
  Filled 2016-03-17: qty 2

## 2016-03-17 NOTE — H&P (Signed)
[facility] [place] NAME: Brent Pollard    MR#:  [id number]  DATE OF BIRTH:  [birth year]  DATE OF ADMISSION:  03/17/2016  PRIMARY CARE PHYSICIAN: SPARKS,JEFFREY D, MD   REQUESTING/REFERRING PHYSICIAN: Dr. Merlyn Lot  CHIEF COMPLAINT: Nausea, vomiting, diarrhea since Thursday.    Chief Complaint  Patient presents with  . Emesis  . Diarrhea    HISTORY OF PRESENT ILLNESS:  Brent Pollard  is a 83 y.o. male with a known history of severe  dementia and home hospice brought in by family because of nausea, vomiting, diarrhea since Thursday. Hospice nurse called in for Imodium but creatinine continued to have diarrhea, vomiting, poor by mouth intake, brought by family because of that. Patient  CT of the abdomen showed colitis. Stool for C. difficile has been negative, flu test is negative. Since lactic acid 2.5, white count 16.6. Patient is oriented to his name only. Patient had multiple episodes of diarrhea.  PAST MEDICAL HISTORY:   Past Medical History:  Diagnosis Date  . Dementia   . Diabetes mellitus without complication ([place])   . Renal disorder   . Thyroid disease     PAST SURGICAL HISTOIRY:   Past Surgical History:  Procedure Laterality Date  . ANKLE SURGERY    . HIP SURGERY    . THYROID SURGERY      SOCIAL HISTORY:   Social History  Substance Use Topics  . Smoking status: Never Smoker  . Smokeless tobacco: Never Used  . Alcohol use No    FAMILY HISTORY:  History reviewed. No pertinent family history.  DRUG ALLERGIES:   Allergies  Allergen Reactions  . Ativan [Lorazepam]     REVIEW OF SYSTEMS:  Unable  to obtain review of systems because of dementia.  MEDICATIONS AT HOME:   Prior to Admission medications   Medication Sig Start Date End Date Taking? Authorizing Provider  albuterol (PROVENTIL HFA;VENTOLIN HFA) 108 (90 Base) MCG/ACT inhaler Inhale 2 puffs into the lungs every 6 (six) hours as needed  for wheezing or shortness of breath. 06/07/15  Yes Orbie Pyo, MD  cyanocobalamin 1000 MCG tablet Take 1,000 mcg by mouth daily.   Yes Historical Provider, MD  diphenoxylate-atropine (LOMOTIL) 2.5-0.025 MG tablet Take by mouth 4 (four) times daily as needed for diarrhea or loose stools.   Yes Historical Provider, MD  haloperidol (HALDOL) 0.5 MG tablet Take 1 tablet by mouth 2 (two) times daily.  05/12/15  Yes Historical Provider, MD  levothyroxine (SYNTHROID, LEVOTHROID) 75 MCG tablet Take 1 tablet by mouth daily. 05/12/15  Yes Historical Provider, MD  mirtazapine (REMERON) 7.5 MG tablet Take 7.5 mg by mouth at bedtime.   Yes Historical Provider, MD  omeprazole (PRILOSEC) 40 MG capsule Take 1 capsule by mouth daily. 05/12/15  Yes Historical Provider, MD  QUEtiapine (SEROQUEL) 25 MG tablet Take 1 tablet by mouth 2 (two) times daily. 1 tablet in the morning and 2 tablets at bedtime 05/12/15  Yes Historical Provider, MD  repaglinide (PRANDIN) 0.5 MG tablet Take 1 tablet by mouth 3 (three) times daily before meals.  05/12/15  Yes Historical Provider, MD  tamsulosin (FLOMAX) 0.4 MG CAPS capsule Take 1 capsule by mouth daily. 03/17/15  Yes Historical Provider, MD  VICTOZA 18 MG/3ML SOPN Inject 1.2 mg into the skin daily.  05/18/15  Yes Historical Provider, MD      VITAL SIGNS:  Blood pressure (!) 114/59, pulse 76, temperature 98.5 F (36.9 C),  temperature source Oral, resp. rate 18, height 6\' 2"  (1.88 m), weight 99.8 kg (220 lb), SpO2 98 %.  PHYSICAL EXAMINATION:  GENERAL:  83 y.o.-year-old patient lying in the bed with no acute distress.  EYES: Pupils equal, round, reactive to light and accommodation. No scleral icterus. Extraocular muscles intact.  HEENT: Head atraumatic, normocephalic. Oropharynx and nasopharynx clear.  NECK:  Supple, no jugular venous distention. No thyroid enlargement, no tenderness.  LUNGS: Normal breath sounds bilaterally, no wheezing, rales,rhonchi or crepitation. No use  of accessory muscles of respiration.  CARDIOVASCULAR: S1, S2 normal. No murmurs, rubs, or gallops.  ABDOMEN: Soft, nontender, nondistended. Bowel sounds present. No organomegaly or mass.  EXTREMITIES: No pedal edema, cyanosis, or clubbing.  NEUROLOGIC: Unable to do full neurological exam because of severe dementia PSYCHIATRIC:Oriented to his name, date of birth only.  SKIN: No obvious rash, lesion, or ulcer.   LABORATORY PANEL:   CBC  Recent Labs Lab 03/17/16 1651  WBC 16.6*  HGB 13.6  HCT 41.3  PLT 265   ------------------------------------------------------------------------------------------------------------------  Chemistries   Recent Labs Lab 03/17/16 1651  NA 136  K 4.6  CL 102  CO2 25  GLUCOSE 210*  BUN 20  CREATININE 1.44*  CALCIUM 8.7*  AST 25  ALT 14*  ALKPHOS 99  BILITOT 0.8   ------------------------------------------------------------------------------------------------------------------  Cardiac Enzymes No results for input(s): TROPONINI in the last 168 hours. ------------------------------------------------------------------------------------------------------------------  RADIOLOGY:  Ct Abdomen Pelvis Wo Contrast  Result Date: 03/17/2016 CLINICAL DATA:  Patient with nausea, vomiting and diarrhea for 1 week. EXAM: CT ABDOMEN AND PELVIS WITHOUT CONTRAST TECHNIQUE: Multidetector CT imaging of the abdomen and pelvis was performed following the standard protocol without IV contrast. COMPARISON:  CT abdomen pelvis 08/14/2012. FINDINGS: Lower chest: Normal heart size. Coronary arterial vascular calcifications. No pericardial effusion. Dependent atelectasis within the bilateral lower lobes. Patchy ground-glass opacities within the lingula and left lower lobe. Hepatobiliary: The liver is normal in size and contour. Multiple stones within the gallbladder lumen. Small amount of gas within the gallbladder lumen, nonspecific. No gallbladder wall thickening or  pericholecystic fluid. Pancreas: Unremarkable Spleen: Unremarkable Adrenals/Urinary Tract: Stable nodularity of the left adrenal gland. Right adrenal gland is normal. Kidneys are symmetric in size. No hydronephrosis. Urinary bladder is distended. Partially exophytic cyst off the interpolar region of the left and right kidney. Stomach/Bowel: Circumferential wall thickening of the rectum with perirectal fat stranding (image 88; series 2). Additionally there is suggestion of wall thickening of the cecum with surrounding fat stranding. No evidence for bowel obstruction. Normal morphology of the stomach. Vascular/Lymphatic: Normal caliber abdominal aorta. Peripheral calcified atherosclerotic plaque. No retroperitoneal lymphadenopathy. Reproductive: Prostate is mildly enlarged. Other: Small bilateral fat containing inguinal hernias. Musculoskeletal: Patient status post ORIF complex right hemipelvis fracture involving the acetabulum, ilium and pubic rami. There is soft tissue fullness about the fracture site. Chronic L1 compression deformity. IMPRESSION: There is circumferential wall thickening of the rectum with surrounding fluid and fat stranding. Additionally there is wall thickening of the cecum and part of the ascending colon. Findings are concerning for colitis. Cholelithiasis. Small amount of gas non dependent within the gallbladder lumen is nonspecific in etiology. Recommend correlation for prior sphincterotomy. Findings compatible with prior ORIF complex right hemipelvis fractures. There is soft tissue about the joint space and fractured bones which may be sequelae of the trauma. Superimposed infection cannot be excluded. Aortic atherosclerosis. Electronically Signed   By: Lovey Newcomer M.D.   On: 03/17/2016 18:14   Dg Chest 2 View  Result Date: 03/17/2016 CLINICAL DATA:  83 year old male with history of nausea, vomiting and diarrhea for the past 3 days. EXAM: CHEST  2 VIEW COMPARISON:  Chest x-ray 06/07/2015.  FINDINGS: Lung volumes are low. No consolidative airspace disease. Mild scarring in the left lower lobe is unchanged. No pleural effusions. No pneumothorax. No pulmonary nodule or mass noted. Pulmonary vasculature and the cardiomediastinal silhouette are within normal limits. Aortic atherosclerosis. IMPRESSION: 1. Low lung volumes without radiographic evidence of acute cardiopulmonary disease. 2. Aortic atherosclerosis. Electronically Signed   By: Vinnie Langton M.D.   On: 03/17/2016 18:16    EKG:   Orders placed or performed during the hospital encounter of 06/07/15  . ED EKG  . ED EKG  . EKG 12-Lead  . EKG 12-Lead  . EKG    IMPRESSION AND PLAN:   #83 year old male patient with severe dementia followed by hospice at home having diarrhea, nausea, vomiting.  #1 acute colitis without C. difficile. Admitted to hospitalist service for, started on Cipro, Unasyn.  #2 sepsis secondary to acute colitis: Continue IV fluids, follow blood cultures, stool for GI panel as well.  #3. severe dementia; patient is on mirtazapine, Seroquel, Haldol.  #4 /diabetes mellitus type 2: Because of diarrhea, nausea hold the Prandin, Victoza, continue sliding scale with coverage only.   ER physician discussed with family about DO NOT RESUSCITATE, patient's family agreed for DO NOT RESUSCITATE but during my visit I did not see any family members..need  To confirm the DO NOT RESUSCITATE status    All the records are reviewed and case discussed with ED provider. Management plans discussed with the patient, family and they are in agreement.  CODE STATUS: full  TOTAL TIME TAKING CARE OF THIS PATIENT: 55 minutes.    Epifanio Lesches M.D on 03/17/2016 at 7:08 PM  Between 7am to 6pm - Pager - [phone]  After 6pm go to [email or web address] - password [email or web address] [facility]  Office  [phone]  CC: Primary care physician; Idelle Crouch, MD  Note: This dictation was prepared with  Dragon dictation along with smaller phrase technology. Any transcriptional errors that result from this process are unintentional.

## 2016-03-17 NOTE — ED Triage Notes (Signed)
Pt presents to ED 03 via EMS, c/o nausea, vomiting, diarrhea that started this past Thursday; Pt is from home, is a hospice patient for renal failure; per EMS lungs clear bilaterally, LLQ rebound tenderness/pain; VS by EMS, BP 132/78, HR 57, 95% on RA; At this time pt is alert only to himself, does not express any pain upon palpitation of the abdomen; pt did have a dirty brief, pt cleaned up, brief changed; pt does not appear to be in distress at this time; per pt caregiver pt is unable to keep anything down today, has had numerous episodes of emesis today and symptoms have gotten continually worse since Thursday.

## 2016-03-17 NOTE — ED Notes (Signed)
Pt had soiled his briefs; pt cleaned up, briefs changed, complete linen change.

## 2016-03-17 NOTE — ED Provider Notes (Signed)
[facility] Emergency Department Provider Note    First MD Initiated Contact with Patient 03/17/16 1646     (approximate)  I have reviewed the triage vital signs and the nursing notes.   HISTORY  Chief Complaint Emesis and Diarrhea  Level V Caveat:  Dementia  HPI Brent Pollard is a 83 y.o. male with severe dementia at home hospice presents with 3 days of nausea vomiting and diarrhea. Patient arrives via EMS. Unable to provide any history due to dementia. Reportedly was dehydrated secondary to multiple episodes of nonbloody non-melanotic diarrhea. Patient also reported with history of chronic kidney disease. No measured fevers. Normotensive and hemodynamic stable in route with EMS. Blood sugar greater than 100.  Interval history: Patient's niece and caregiver now at bedside. States that the power of attorney is on the medics most of the decisions for the patient. It is the patient's sister. According to family at bedside she has not been to visit the patient for several weeks. States that the patient is on home hospice after recently being evaluated by PCP, Dr. Doy Hutching last week. Had good conversation with home hospice and that is now in place. According to the family the POA still wants the patient to be a full code.  Past Medical History:  Diagnosis Date  . Dementia   . Diabetes mellitus without complication ([place])   . Renal disorder   . Thyroid disease    History reviewed. No pertinent family history. Past Surgical History:  Procedure Laterality Date  . ANKLE SURGERY    . HIP SURGERY    . THYROID SURGERY     There are no active problems to display for this patient.     Prior to Admission medications   Medication Sig Start Date End Date Taking? Authorizing Provider  albuterol (PROVENTIL HFA;VENTOLIN HFA) 108 (90 Base) MCG/ACT inhaler Inhale 2 puffs into the lungs every 6 (six) hours as needed for wheezing or shortness of breath. 06/07/15  Yes  Orbie Pyo, MD  cyanocobalamin 1000 MCG tablet Take 1,000 mcg by mouth daily.   Yes Historical Provider, MD  diphenoxylate-atropine (LOMOTIL) 2.5-0.025 MG tablet Take by mouth 4 (four) times daily as needed for diarrhea or loose stools.   Yes Historical Provider, MD  haloperidol (HALDOL) 0.5 MG tablet Take 1 tablet by mouth 2 (two) times daily.  05/12/15  Yes Historical Provider, MD  levothyroxine (SYNTHROID, LEVOTHROID) 75 MCG tablet Take 1 tablet by mouth daily. 05/12/15  Yes Historical Provider, MD  mirtazapine (REMERON) 7.5 MG tablet Take 7.5 mg by mouth at bedtime.   Yes Historical Provider, MD  omeprazole (PRILOSEC) 40 MG capsule Take 1 capsule by mouth daily. 05/12/15  Yes Historical Provider, MD  QUEtiapine (SEROQUEL) 25 MG tablet Take 1 tablet by mouth 2 (two) times daily. 1 tablet in the morning and 2 tablets at bedtime 05/12/15  Yes Historical Provider, MD  repaglinide (PRANDIN) 0.5 MG tablet Take 1 tablet by mouth 3 (three) times daily before meals.  05/12/15  Yes Historical Provider, MD  tamsulosin (FLOMAX) 0.4 MG CAPS capsule Take 1 capsule by mouth daily. 03/17/15  Yes Historical Provider, MD  VICTOZA 18 MG/3ML SOPN Inject 1.2 mg into the skin daily.  05/18/15  Yes Historical Provider, MD    Allergies Ativan [lorazepam]    Social History Social History  Substance Use Topics  . Smoking status: Never Smoker  . Smokeless tobacco: Never Used  . Alcohol use No    Review of  Systems Unable to obtain 2/2 dementia ____________________________________________   PHYSICAL EXAM:  VITAL SIGNS: Vitals:   03/17/16 1700  BP: (!) 114/59  Pulse: 76  Resp: 18  Temp: 98.5 F (36.9 C)    Constitutional: Alert , elderly and chronically ill appearing in NAD Eyes: Conjunctivae are normal. PERRL. EOMI. Head: Atraumatic. Nose: No congestion/rhinnorhea. Mouth/Throat: Mucous membranes are moist.  Oropharynx non-erythematous. Neck: No stridor. Painless ROM Cardiovascular:  Normal rate, regular rhythm. Grossly normal heart sounds.  Good peripheral circulation. Respiratory: Normal respiratory effort.  No retractions. Lungs CTAB. Gastrointestinal: Soft and mild RLQ ttp without peritonitis. No distention. No abdominal bruits. No CVA tenderness. Musculoskeletal: No lower extremity tenderness nor edema.  No joint effusions. Neurologic:  Normal speech and language. No gross focal neurologic deficits are appreciated. No facial droop Skin:  Skin is warm, dry and intact. No rash noted.   ____________________________________________   LABS (all labs ordered are listed, but only abnormal results are displayed)  Results for orders placed or performed during the hospital encounter of 03/17/16 (from the past 24 hour(s))  CBC     Status: Abnormal   Collection Time: 03/17/16  4:51 PM  Result Value Ref Range   WBC 16.6 (H) 3.8 - 10.6 K/uL   RBC 5.01 4.40 - 5.90 MIL/uL   Hemoglobin 13.6 13.0 - 18.0 g/dL   HCT 41.3 40.0 - 52.0 %   MCV 82.3 80.0 - 100.0 fL   MCH 27.1 26.0 - 34.0 pg   MCHC 32.9 32.0 - 36.0 g/dL   RDW 17.4 (H) 11.5 - 14.5 %   Platelets 265 150 - 440 K/uL  Comprehensive metabolic panel     Status: Abnormal   Collection Time: 03/17/16  4:51 PM  Result Value Ref Range   Sodium 136 135 - 145 mmol/L   Potassium 4.6 3.5 - 5.1 mmol/L   Chloride 102 101 - 111 mmol/L   CO2 25 22 - 32 mmol/L   Glucose, Bld 210 (H) 65 - 99 mg/dL   BUN 20 6 - 20 mg/dL   Creatinine, Ser 1.44 (H) 0.61 - 1.24 mg/dL   Calcium 8.7 (L) 8.9 - 10.3 mg/dL   Total Protein 7.7 6.5 - 8.1 g/dL   Albumin 3.6 3.5 - 5.0 g/dL   AST 25 15 - 41 U/L   ALT 14 (L) 17 - 63 U/L   Alkaline Phosphatase 99 38 - 126 U/L   Total Bilirubin 0.8 0.3 - 1.2 mg/dL   GFR calc non Af Amer 42 (L) >60 mL/min   GFR calc Af Amer 49 (L) >60 mL/min   Anion gap 9 5 - 15  Lactic acid, plasma     Status: Abnormal   Collection Time: 03/17/16  4:51 PM  Result Value Ref Range   Lactic Acid, Venous 2.5 (HH) 0.5 - 1.9  mmol/L  Influenza panel by PCR (type A & B)     Status: None   Collection Time: 03/17/16  4:51 PM  Result Value Ref Range   Influenza A By PCR NEGATIVE NEGATIVE   Influenza B By PCR NEGATIVE NEGATIVE  C difficile quick scan w PCR reflex     Status: None   Collection Time: 03/17/16  5:20 PM  Result Value Ref Range   C Diff antigen NEGATIVE NEGATIVE   C Diff toxin NEGATIVE NEGATIVE   C Diff interpretation No C. difficile detected.    ____________________________________________  EKG ____________________________________________  RADIOLOGY I personally reviewed all radiographic images ordered to evaluate for  the above acute complaints and reviewed radiology reports and findings.  These findings were personally discussed with the patient.  Please see medical record for radiology report.  ____________________________________________   PROCEDURES  Procedure(s) performed:  Procedures    Critical Care performed: yes CRITICAL CARE Performed by: Merlyn Lot   Total critical care time: 30 minutes  Critical care time was exclusive of separately billable procedures and treating other patients.  Critical care was necessary to treat or prevent imminent or life-threatening deterioration.  Critical care was time spent personally by me on the following activities: development of treatment plan with patient and/or surrogate as well as nursing, discussions with consultants, evaluation of patient's response to treatment, examination of patient, obtaining history from patient or surrogate, ordering and performing treatments and interventions, ordering and review of laboratory studies, ordering and review of radiographic studies, pulse oximetry and re-evaluation of patient's condition.  ____________________________________________   INITIAL IMPRESSION / ASSESSMENT AND PLAN / ED COURSE  Pertinent labs & imaging results that were available during my care of the patient were reviewed by me  and considered in my medical decision making (see chart for details).  DDX: enteritis, flu, pna, diverticulosis, flu, colitis, sbo  Brent Pollard is a 83 y.o. who presents to the ED with acute nausea vomiting diarrhea has become acutely worse. Patient with complex medical history with severe dementia as well as chronic kidney disease. Patient is acutely tender in his abdomen pain and concern for processes such as appendicitis or diverticulitis. Other pathology such as C. difficile and flu also on the differential however liver less likely as the patient is not been on any antibiotics recently. He does appear dehydrated clinically and with the report of acute hypotension at home will provide IV fluid bolus.  The patient will be placed on continuous pulse oximetry and telemetry for monitoring.  Laboratory evaluation will be sent to evaluate for the above complaints.     Clinical Course as of Mar 17 1836  Sat Mar 17, 2016  1833 Chest x-ray with no evidence of pneumonia. CT imaging showing evidence of acute colitis. His C. difficile test is negative. Does have a lactic acidosis and in the setting of his acute leukocytosis with hypotension at home patient does meet criteria for sepsis therefore will continue with IV fluid resuscitation. We'll provide infusion rather than of bolus as he is not hypotensive at this time. Will treat with antibiotics. Had extensive conversation with the patient's niece regarding his wishes and state that the patient would not want any life saving measures.  Given his frailty, recent placement on hospice and severe dementia I do feel is appropriate for patient continue with IV fluids and antibiotics. We'll respect his and his family's wish is being made a DO NOT RESUSCITATE.   Have discussed with the patient and available family all diagnostics and treatments performed thus far and all questions were answered to the best of my ability. The patient demonstrates understanding and  agreement with plan.  [PR]    Clinical Course User Index [PR] Merlyn Lot, MD     ____________________________________________   FINAL CLINICAL IMPRESSION(S) / ED DIAGNOSES  Final diagnoses:  Sepsis, due to unspecified organism ([place])  Colitis  Nausea vomiting and diarrhea  Lactic acidosis      NEW MEDICATIONS STARTED DURING THIS VISIT:  New Prescriptions   No medications on file     Note:  This document was prepared using Dragon voice recognition software and may include unintentional dictation  errors.    Merlyn Lot, MD 03/17/16 [phone]

## 2016-03-18 LAB — GASTROINTESTINAL PANEL BY PCR, STOOL (REPLACES STOOL CULTURE)

## 2016-03-18 LAB — BASIC METABOLIC PANEL
Anion gap: 7 (ref 5–15)
BUN: 16 mg/dL (ref 6–20)
CHLORIDE: 105 mmol/L (ref 101–111)
CO2: 24 mmol/L (ref 22–32)
CREATININE: 1.23 mg/dL (ref 0.61–1.24)
Calcium: 7.9 mg/dL — ABNORMAL LOW (ref 8.9–10.3)
GFR calc Af Amer: 59 mL/min — ABNORMAL LOW (ref >60)
GFR calc non Af Amer: 51 mL/min — ABNORMAL LOW (ref >60)
GLUCOSE: 127 mg/dL — AB (ref 65–99)
POTASSIUM: 4.1 mmol/L (ref 3.5–5.1)
Sodium: 136 mmol/L (ref 135–145)

## 2016-03-18 LAB — CBC
HEMATOCRIT: 33.8 % — AB (ref 40.0–52.0)
Hemoglobin: 11.1 g/dL — ABNORMAL LOW (ref 13.0–18.0)
MCH: 26.7 pg (ref 26.0–34.0)
MCHC: 32.9 g/dL (ref 32.0–36.0)
MCV: 81.2 fL (ref 80.0–100.0)
Platelets: 226 10*3/uL (ref 150–440)
RBC: 4.16 MIL/uL — ABNORMAL LOW (ref 4.40–5.90)
RDW: 16.8 % — AB (ref 11.5–14.5)
WBC: 13.4 10*3/uL — ABNORMAL HIGH (ref 3.8–10.6)

## 2016-03-18 LAB — GLUCOSE, CAPILLARY
GLUCOSE-CAPILLARY: 154 mg/dL — AB (ref 65–99)
GLUCOSE-CAPILLARY: 110 mg/dL — AB (ref 65–99)
Glucose-Capillary: 131 mg/dL — ABNORMAL HIGH (ref 65–99)
Glucose-Capillary: 152 mg/dL — ABNORMAL HIGH (ref 65–99)

## 2016-03-18 NOTE — Progress Notes (Signed)
HPOA is in pt's chart.

## 2016-03-18 NOTE — Progress Notes (Signed)
[place] at [place] NAME: Brent Pollard    MR#:  [id number]  DATE OF BIRTH:  Jun 21, 1926  SUBJECTIVE:   Patient is here due to diarrhea and nausea vomiting and CT scan showing evidence of colitis. No diarrhea overnight. Remains lethargic. Stool is negative for C. difficile.  REVIEW OF SYSTEMS:    Review of Systems  Unable to perform ROS: Mental acuity    Nutrition: Full liquid Tolerating Diet: Yes Tolerating PT: Await Eval.    DRUG ALLERGIES:   Allergies  Allergen Reactions  . Ativan [Lorazepam]     VITALS:  Blood pressure (!) 100/56, pulse (!) 25, temperature 98.2 F (36.8 C), temperature source Oral, resp. rate 18, height 6\' 2"  (1.88 m), weight 97.8 kg (215 lb 9.8 oz), SpO2 96 %.  PHYSICAL EXAMINATION:   Physical Exam  GENERAL:  83 y.o.-year-old patient lying in the bed lethargic but in no acute distress.  EYES: Pupils equal, round, reactive to light. No scleral icterus. Extraocular muscles intact.  HEENT: Head atraumatic, normocephalic. Oropharynx and nasopharynx clear.  NECK:  Supple, no jugular venous distention. No thyroid enlargement, no tenderness.  LUNGS: Normal breath sounds bilaterally, no wheezing, rales, rhonchi. No use of accessory muscles of respiration.  CARDIOVASCULAR: S1, S2 normal. No murmurs, rubs, or gallops.  ABDOMEN: Soft, nontender, nondistended. Bowel sounds present. No organomegaly or mass.  EXTREMITIES: No cyanosis, clubbing or edema b/l.    NEUROLOGIC: Cranial nerves II through XII are intact. No focal Motor or sensory deficits b/l. Globally weak   PSYCHIATRIC: The patient is alert and oriented x 1. SKIN: No obvious rash, lesion, or ulcer.    LABORATORY PANEL:   CBC  Recent Labs Lab 03/18/16 0321  WBC 13.4*  HGB 11.1*  HCT 33.8*  PLT 226   ------------------------------------------------------------------------------------------------------------------  Chemistries   Recent Labs Lab  03/17/16 1651 03/18/16 0321  NA 136 136  K 4.6 4.1  CL 102 105  CO2 25 24  GLUCOSE 210* 127*  BUN 20 16  CREATININE 1.44* 1.23  CALCIUM 8.7* 7.9*  AST 25  --   ALT 14*  --   ALKPHOS 99  --   BILITOT 0.8  --    ------------------------------------------------------------------------------------------------------------------  Cardiac Enzymes No results for input(s): TROPONINI in the last 168 hours. ------------------------------------------------------------------------------------------------------------------  RADIOLOGY:  Ct Abdomen Pelvis Wo Contrast  Result Date: 03/17/2016 CLINICAL DATA:  Patient with nausea, vomiting and diarrhea for 1 week. EXAM: CT ABDOMEN AND PELVIS WITHOUT CONTRAST TECHNIQUE: Multidetector CT imaging of the abdomen and pelvis was performed following the standard protocol without IV contrast. COMPARISON:  CT abdomen pelvis 08/14/2012. FINDINGS: Lower chest: Normal heart size. Coronary arterial vascular calcifications. No pericardial effusion. Dependent atelectasis within the bilateral lower lobes. Patchy ground-glass opacities within the lingula and left lower lobe. Hepatobiliary: The liver is normal in size and contour. Multiple stones within the gallbladder lumen. Small amount of gas within the gallbladder lumen, nonspecific. No gallbladder wall thickening or pericholecystic fluid. Pancreas: Unremarkable Spleen: Unremarkable Adrenals/Urinary Tract: Stable nodularity of the left adrenal gland. Right adrenal gland is normal. Kidneys are symmetric in size. No hydronephrosis. Urinary bladder is distended. Partially exophytic cyst off the interpolar region of the left and right kidney. Stomach/Bowel: Circumferential wall thickening of the rectum with perirectal fat stranding (image 88; series 2). Additionally there is suggestion of wall thickening of the cecum with surrounding fat stranding. No evidence for bowel obstruction. Normal morphology of the stomach.  Vascular/Lymphatic: Normal caliber  abdominal aorta. Peripheral calcified atherosclerotic plaque. No retroperitoneal lymphadenopathy. Reproductive: Prostate is mildly enlarged. Other: Small bilateral fat containing inguinal hernias. Musculoskeletal: Patient status post ORIF complex right hemipelvis fracture involving the acetabulum, ilium and pubic rami. There is soft tissue fullness about the fracture site. Chronic L1 compression deformity. IMPRESSION: There is circumferential wall thickening of the rectum with surrounding fluid and fat stranding. Additionally there is wall thickening of the cecum and part of the ascending colon. Findings are concerning for colitis. Cholelithiasis. Small amount of gas non dependent within the gallbladder lumen is nonspecific in etiology. Recommend correlation for prior sphincterotomy. Findings compatible with prior ORIF complex right hemipelvis fractures. There is soft tissue about the joint space and fractured bones which may be sequelae of the trauma. Superimposed infection cannot be excluded. Aortic atherosclerosis. Electronically Signed   By: Lovey Newcomer M.D.   On: 03/17/2016 18:14   Dg Chest 2 View  Result Date: 03/17/2016 CLINICAL DATA:  83 year old male with history of nausea, vomiting and diarrhea for the past 3 days. EXAM: CHEST  2 VIEW COMPARISON:  Chest x-ray 06/07/2015. FINDINGS: Lung volumes are low. No consolidative airspace disease. Mild scarring in the left lower lobe is unchanged. No pleural effusions. No pneumothorax. No pulmonary nodule or mass noted. Pulmonary vasculature and the cardiomediastinal silhouette are within normal limits. Aortic atherosclerosis. IMPRESSION: 1. Low lung volumes without radiographic evidence of acute cardiopulmonary disease. 2. Aortic atherosclerosis. Electronically Signed   By: Vinnie Langton M.D.   On: 03/17/2016 18:16     ASSESSMENT AND PLAN:   83 year old male with advanced dementia, hypothyroidism, diabetes who presents  to the hospital due to nausea vomiting and diarrhea.  1. Colitis-this is a cause of patient's nausea vomiting and diarrhea. Patient's stool for C. difficile is negative. -Diarrhea, nausea vomiting much improved. Continue supportive care with IV fluids, antibiotics. Continue IV Unasyn.  2. Leukocytosis - due to # 1.  - improving w/ IV abx.   3. DM - cont. SSI and follow BS  4. Hypothyroidism - cont. Synthroid.   5. BPH - cont. Flomax.   6. Dementia - cont. Seroquel, Remeron   All the records are reviewed and case discussed with Care Management/Social Worker. Management plans discussed with the patient, family and they are in agreement.  CODE STATUS: Full code  DVT Prophylaxis: Hep SQ  TOTAL TIME TAKING CARE OF THIS PATIENT: 30 minutes.   POSSIBLE D/C IN 1-2 DAYS, DEPENDING ON CLINICAL CONDITION.   Henreitta Leber M.D on 03/18/2016 at 11:34 AM  Between 7am to 6pm - Pager - [phone]  After 6pm go to [email or web address] - Proofreader  [facility]  Office  [phone]  CC: Primary care physician; Idelle Crouch, MD

## 2016-03-18 NOTE — Progress Notes (Signed)
   [place] at [facility] Day: 1 day Brent Pollard is a 83 y.o. male presenting with Emesis and Diarrhea .   Advance care planning discussed with patient  with additional Family at bedside. All questions in regards to overall condition and expected prognosis answered. The decision was made to change current code status  CODE STATUS: dnr Time spent: 16 minutes

## 2016-03-18 NOTE — Progress Notes (Signed)
Prime doc notified regarding pt code status. Granddaughter at bedside stated she wants code status to be DNR, and the POA is pt's daughter. Family asked to bring paperwork. Dr. Ara Kussmaul stated she will seek clarification.

## 2016-03-18 NOTE — Progress Notes (Signed)
Patient ID: Brent Pollard, male   DOB: [birth year], 83 y.o.   MRN: [id number]   Spoke with patient's daughter healthcare power of attorney Quincy Sheehan at [phone].  She indicates that her family decided that the patient would be a FULL CODE. Her family will discuss in the morning to reevaluate depending on his status.

## 2016-03-19 LAB — GLUCOSE, CAPILLARY
GLUCOSE-CAPILLARY: 124 mg/dL — AB (ref 65–99)
GLUCOSE-CAPILLARY: 149 mg/dL — AB (ref 65–99)
Glucose-Capillary: 95 mg/dL (ref 65–99)
Glucose-Capillary: 163 mg/dL — ABNORMAL HIGH (ref 65–99)

## 2016-03-19 MED ORDER — ENOXAPARIN SODIUM 40 MG/0.4ML ~~LOC~~ SOLN
40.0000 mg | SUBCUTANEOUS | Status: DC
  Administered 2016-03-19 – 2016-03-20 (×2): 40 mg via SUBCUTANEOUS
  Filled 2016-03-19 (×2): qty 0.4

## 2016-03-19 NOTE — Evaluation (Signed)
Clinical/Bedside Swallow Evaluation Patient Details  Name: Brent Pollard MRN: [id number] Date of Birth: [birth year]  Today's Date: 03/19/2016 Time: SLP Start Time (ACUTE ONLY): 14 SLP Stop Time (ACUTE ONLY): 1130 SLP Time Calculation (min) (ACUTE ONLY): 60 min  Past Medical History:  Past Medical History:  Diagnosis Date  . Dementia   . Diabetes mellitus without complication ([place])   . Renal disorder   . Thyroid disease    Past Surgical History:  Past Surgical History:  Procedure Laterality Date  . ANKLE SURGERY    . HIP SURGERY    . THYROID SURGERY     HPI:  Pt is a 83 y.o. male with a known history of severe  dementia and home hospice brought in by family because of nausea, vomiting, diarrhea since Thursday. Hospice nurse called in for Imodium but creatinine continued to have diarrhea, vomiting, poor by mouth intake, brought by family because of that. Patient  CT of the abdomen showed colitis. Stool for C. difficile has been negative, flu test is negative. Since lactic acid 2.5, white count 16.6. Patient is oriented to his name only. Patient had multiple episodes of diarrhea. Currently, pt is only oriented to self; when asked what town he was in- [place]" and when asked where he was he stated "home place".    Assessment / Plan / Recommendation Clinical Impression  Pt appears to present at mild aspiration risk w/ overt coughing noted w/ trials of thin liquids despite using aspiration precautions. Pt given trials of thin liquids, nectar thickened liquids, puree and softened solids. Pt with immediate cough and increased respiratory rate with 2/2 trials of thin liquids. When given nectar thickened liquids, pt did not appear with any s/sx of aspiration; consumed 4 oz via straw. Pt also did not present with any overt s/sx of aspiration with puree or softened solids. No oral phase deficits noted; adeqaute control of bolus and appropriate mastication w/ soft solids. Pt was ony  alert to self; when asked what town he was in, responded "[employer]". Pt does have dx of severe dementia per chart review. Recommend pt on dysphagia 3 diet (mech soft) with nectar thickened liquids following general aspiration precautions. Pt requires tray set up and assist with meals. Skilled ST services to f/u with diet toleration/upgrade.      Aspiration Risk  Mild aspiration risk-moderate aspiration risk   Diet Recommendation  Dysphagia level 3 w/ Nectar liquids; aspiration precautions; tray setup and feeding support at meals.    Medication Administration: Whole meds with puree    Other  Recommendations Recommended Consults:  (dietician) Oral Care Recommendations: Oral care BID;Staff/trained caregiver to provide oral care   Follow up Recommendations  (TBD)      Frequency and Duration min 3x week  2 weeks       Prognosis Prognosis for Safe Diet Advancement: Good Barriers to Reach Goals: Cognitive deficits      Swallow Study   General Date of Onset: 03/17/16 HPI: Pt is a 83 y.o. male with a known history of severe  dementia and home hospice brought in by family because of nausea, vomiting, diarrhea since Thursday. Hospice nurse called in for Imodium but creatinine continued to have diarrhea, vomiting, poor by mouth intake, brought by family because of that. Patient  CT of the abdomen showed colitis. Stool for C. difficile has been negative, flu test is negative. Since lactic acid 2.5, white count 16.6. Patient is oriented to his name only. Patient had multiple  episodes of diarrhea. Currently, pt is only oriented to self; when asked what town he was in- "[place], [place]'s all" and when asked where he was he stated "home place".  Type of Study: Bedside Swallow Evaluation Previous Swallow Assessment: none noted Diet Prior to this Study: Regular;Thin liquids Temperature Spikes Noted: No Respiratory Status: Room air History of Recent Intubation: No Behavior/Cognition:  Cooperative;Pleasant mood;Requires cueing Oral Cavity Assessment: Within Functional Limits Oral Care Completed by SLP: Recent completion by staff Oral Cavity - Dentition: Missing dentition Vision: Functional for self-feeding Self-Feeding Abilities: Needs assist;Needs set up;Able to feed self Patient Positioning: Upright in bed Baseline Vocal Quality: Normal Volitional Cough: Strong Volitional Swallow:  (dnt)    Oral/Motor/Sensory Function Overall Oral Motor/Sensory Function: Within functional limits   Ice Chips Ice chips: Not tested   Thin Liquid Thin Liquid: Impaired Presentation: Self Fed;Cup (X2 trials- immediate cough X2) Pharyngeal  Phase Impairments: Cough - Immediate    Nectar Thick Nectar Thick Liquid: Within functional limits Presentation: Cup;Self Fed (consumed 4 oz)   Honey Thick Honey Thick Liquid: Not tested   Puree Puree: Within functional limits Presentation: Spoon;Self Fed (X10+ trials)   Solid   GO   Solid: Within functional limits Presentation: Self Fed;Spoon (softened solids X4 trials)        Brent Pollard, B.S Graduate Student 03/19/2016,2:35 PM  This information has been reviewed and agreed upon by this supervising clinician.  Orinda Kenner, [place], CCC-SLP

## 2016-03-19 NOTE — Progress Notes (Signed)
Visit made. Patient is currently followed by [facility] at home with a hospice diagnosis of Alzheimer's disease. He lives with his wife and a 24 hour caregiver. At home he was a full code. Per chart note review he is now a DNR. Patient was admitted to [facility] on 2/17 for evaluation of nausea/vomitine and diarrhea. Stool negative for C-diff. CT in the ED revealed possible colitis, labs were positive for elevated WBC, he has been receiving IV antibiotics. Per chart note review and discussion with Staff RN Benancio Deeds and aide Christie Beckers patient ate well this morning. Speech therapy evaluation is complete, and is recomending nectar thick liquids. Patient seen lying in bed, appeared to be sleeping, sister in law and brother in law present at bedside. Patient roused slightly to voice, roused more when spoken to by attending physician Dr. Manuella Ghazi. He continues on IV antibiotics, no discharge plan/date at this time. Hospice team updated. Hospital care team aware of Hospice involvement. Thank you. Flo Shanks RN, BSN, [facility] [place], hospital liaison [phone] c

## 2016-03-19 NOTE — Progress Notes (Signed)
[place] at [place] NAME: Brent Pollard    MR#:  [id number]  DATE OF BIRTH:  November 19, 1926  SUBJECTIVE:  CHIEF COMPLAINT:   Chief Complaint  Patient presents with  . Emesis  . Diarrhea  Patient is somewhat sleepy but does wake up on verbal commands, his family is at the bedside REVIEW OF SYSTEMS:  Review of Systems  Constitutional: Negative for chills, fever and weight loss.  HENT: Negative for nosebleeds and sore throat.   Eyes: Negative for blurred vision.  Respiratory: Negative for cough, shortness of breath and wheezing.   Cardiovascular: Negative for chest pain, orthopnea, leg swelling and PND.  Gastrointestinal: Positive for abdominal pain. Negative for constipation, diarrhea, heartburn, nausea and vomiting.  Genitourinary: Negative for dysuria and urgency.  Musculoskeletal: Negative for back pain.  Skin: Negative for rash.  Neurological: Negative for dizziness, speech change, focal weakness and headaches.  Endo/Heme/Allergies: Does not bruise/bleed easily.  Psychiatric/Behavioral: Negative for depression.    DRUG ALLERGIES:   Allergies  Allergen Reactions  . Ativan [Lorazepam]    VITALS:  Blood pressure 118/60, pulse 68, temperature 98 F (36.7 C), temperature source Oral, resp. rate 17, height 6\' 2"  (1.88 m), weight 98.8 kg (217 lb 14.4 oz), SpO2 93 %. PHYSICAL EXAMINATION:  Physical Exam  Constitutional: He is oriented to person, place, and time and well-developed, well-nourished, and in no distress.  HENT:  Head: Normocephalic and atraumatic.  Eyes: Conjunctivae and EOM are normal. Pupils are equal, round, and reactive to light.  Neck: Normal range of motion. Neck supple. No tracheal deviation present. No thyromegaly present.  Cardiovascular: Normal rate, regular rhythm and normal heart sounds.   Pulmonary/Chest: Effort normal and breath sounds normal. No respiratory distress. He has no wheezes. He exhibits no  tenderness.  Abdominal: Soft. Bowel sounds are normal. He exhibits no distension. There is no tenderness.  Musculoskeletal: Normal range of motion.  Neurological: He is alert and oriented to person, place, and time. No cranial nerve deficit.  Skin: Skin is warm and dry. No rash noted.  Psychiatric: Mood and affect normal.   LABORATORY PANEL:  Male CBC  Recent Labs Lab 03/18/16 0321  WBC 13.4*  HGB 11.1*  HCT 33.8*  PLT 226   ------------------------------------------------------------------------------------------------------------------ Chemistries   Recent Labs Lab 03/17/16 1651 03/18/16 0321  NA 136 136  K 4.6 4.1  CL 102 105  CO2 25 24  GLUCOSE 210* 127*  BUN 20 16  CREATININE 1.44* 1.23  CALCIUM 8.7* 7.9*  AST 25  --   ALT 14*  --   ALKPHOS 99  --   BILITOT 0.8  --    RADIOLOGY:  No results found. ASSESSMENT AND PLAN:  83 year old male with advanced dementia, hypothyroidism, diabetes who presents to the hospital due to nausea vomiting and diarrhea.  1. Colitis-this is a cause of patient's nausea vomiting and diarrhea. Patient's stool for C. difficile is negative. -Diarrhea, nausea vomiting much improved. Continue supportive care with IV fluids, antibiotics. Continue IV Unasyn. - monitor  2. Leukocytosis - due to # 1.  - improving w/ IV abx.   3. DM - cont. SSI and follow BS  4. Hypothyroidism - cont. Synthroid.   5. BPH - cont. Flomax.   6. Dementia - cont. Seroquel, Remeron  Hospice has been following the patient while in the hospital   All the records are reviewed and case discussed with Care Management/Social Worker. Management plans discussed with the patient,  family and they are in agreement.  CODE STATUS: DNR  TOTAL TIME TAKING CARE OF THIS PATIENT: 35 minutes.   More than 50% of the time was spent in counseling/coordination of care: YES  POSSIBLE D/C IN 1-2 DAYS, DEPENDING ON CLINICAL CONDITION.   Max Sane M.D on 03/19/2016  at 3:51 PM  Between 7am to 6pm - Pager - [phone]  After 6pm go to [email or web address] - Proofreader  [facility]  Office  [phone]  CC: Primary care physician; Idelle Crouch, MD  Note: This dictation was prepared with Dragon dictation along with smaller phrase technology. Any transcriptional errors that result from this process are unintentional.

## 2016-03-19 NOTE — Care Management Note (Signed)
Case Management Note  Patient Details  Name: Brent Pollard MRN: [id number] Date of Birth: 08/11/1926   Patient admitted with Colitis.  History of dementia.  Lives at home with daughter.  PCP [facility].  Pharmacy [facility].  Open with [facility].  [facility] of Hospice aware of admission.  RNCM following.      Expected Discharge Date:                  Expected Discharge Plan:  Home w Hospice Care  In-House Referral:     Discharge planning Services  CM Consult  Post Acute Care Choice:    Choice offered to:     DME Arranged:    DME Agency:     HH Arranged:    [place] Agency:  [facility]  Status of Service:  Completed, signed off  If discussed at [place] of Stay Meetings, dates discussed:    Additional Comments:  Beverly Sessions, RN 03/19/2016, 10:32 AM

## 2016-03-20 LAB — CBC
HEMATOCRIT: 34.7 % — AB (ref 40.0–52.0)
HEMOGLOBIN: 11.1 g/dL — AB (ref 13.0–18.0)
MCH: 26.2 pg (ref 26.0–34.0)
MCHC: 32 g/dL (ref 32.0–36.0)
MCV: 81.9 fL (ref 80.0–100.0)
Platelets: 242 10*3/uL (ref 150–440)
RBC: 4.23 MIL/uL — AB (ref 4.40–5.90)
RDW: 16.5 % — ABNORMAL HIGH (ref 11.5–14.5)
WBC: 6.9 10*3/uL (ref 3.8–10.6)

## 2016-03-20 LAB — GLUCOSE, CAPILLARY
GLUCOSE-CAPILLARY: 144 mg/dL — AB (ref 65–99)
Glucose-Capillary: 136 mg/dL — ABNORMAL HIGH (ref 65–99)
Glucose-Capillary: 137 mg/dL — ABNORMAL HIGH (ref 65–99)
Glucose-Capillary: 152 mg/dL — ABNORMAL HIGH (ref 65–99)

## 2016-03-20 NOTE — Progress Notes (Signed)
[place] at [place] NAME: Brent Pollard    MR#:  [id number]  DATE OF BIRTH:  1926-12-14  SUBJECTIVE:  CHIEF COMPLAINT:   Chief Complaint  Patient presents with  . Emesis  . Diarrhea  Feeling better, no n/v/abd pain. Tolerating diet But sleepy REVIEW OF SYSTEMS:  Review of Systems  Constitutional: Negative for chills, fever and weight loss.  HENT: Negative for nosebleeds and sore throat.   Eyes: Negative for blurred vision.  Respiratory: Negative for cough, shortness of breath and wheezing.   Cardiovascular: Negative for chest pain, orthopnea, leg swelling and PND.  Gastrointestinal: Negative for constipation, diarrhea, heartburn, nausea and vomiting.  Genitourinary: Negative for dysuria and urgency.  Musculoskeletal: Negative for back pain.  Skin: Negative for rash.  Neurological: Negative for dizziness, speech change, focal weakness and headaches.  Endo/Heme/Allergies: Does not bruise/bleed easily.  Psychiatric/Behavioral: Negative for depression.    DRUG ALLERGIES:   Allergies  Allergen Reactions  . Ativan [Lorazepam]    VITALS:  Blood pressure 136/75, pulse 94, temperature 98.3 F (36.8 C), temperature source Oral, resp. rate (!) 22, height 6\' 2"  (1.88 m), weight 103.3 kg (227 lb 11.2 oz), SpO2 97 %. PHYSICAL EXAMINATION:  Physical Exam  Constitutional: He is oriented to person, place, and time and well-developed, well-nourished, and in no distress.  HENT:  Head: Normocephalic and atraumatic.  Eyes: Conjunctivae and EOM are normal. Pupils are equal, round, and reactive to light.  Neck: Normal range of motion. Neck supple. No tracheal deviation present. No thyromegaly present.  Cardiovascular: Normal rate, regular rhythm and normal heart sounds.   Pulmonary/Chest: Effort normal and breath sounds normal. No respiratory distress. He has no wheezes. He exhibits no tenderness.  Abdominal: Soft. Bowel sounds are normal. He  exhibits no distension. There is no tenderness.  Musculoskeletal: Normal range of motion.  Neurological: He is alert and oriented to person, place, and time. No cranial nerve deficit.  Skin: Skin is warm and dry. No rash noted.  Psychiatric: Mood and affect normal.   LABORATORY PANEL:  Male CBC  Recent Labs Lab 03/20/16 1041  WBC 6.9  HGB 11.1*  HCT 34.7*  PLT 242   ------------------------------------------------------------------------------------------------------------------ Chemistries   Recent Labs Lab 03/17/16 1651 03/18/16 0321  NA 136 136  K 4.6 4.1  CL 102 105  CO2 25 24  GLUCOSE 210* 127*  BUN 20 16  CREATININE 1.44* 1.23  CALCIUM 8.7* 7.9*  AST 25  --   ALT 14*  --   ALKPHOS 99  --   BILITOT 0.8  --    RADIOLOGY:  No results found. ASSESSMENT AND PLAN:  83 year old male with advanced dementia, hypothyroidism, diabetes who presents to the hospital due to nausea vomiting and diarrhea.  1. Colitis-this is a cause of patient's nausea vomiting and diarrhea. Patient's stool for C. difficile is negative. -Diarrhea, nausea vomiting much improved. Continue supportive care with IV fluids, antibiotics. Continue IV Unasyn. - monitor - can likely switch to PO Cipro & flagyl on D/C tomorrow  2. Leukocytosis - due to # 1.  - improving w/ IV abx.   3. DM - cont. SSI and follow BS  4. Hypothyroidism - cont. Synthroid.   5. BPH - cont. Flomax.   6. Dementia - cont. Seroquel, Remeron  Hospice has been following the patient while in the hospital   All the records are reviewed and case discussed with Care Management/Social Worker. Management plans discussed with the  patient, family (d/w daughter via phone) and they are in agreement.  CODE STATUS: DNR  TOTAL TIME TAKING CARE OF THIS PATIENT: 35 minutes.   More than 50% of the time was spent in counseling/coordination of care: YES  POSSIBLE D/C IN AM tomorrow, DEPENDING ON CLINICAL  CONDITION.   Max Sane M.D on 03/20/2016 at 4:20 PM  Between 7am to 6pm - Pager - [phone]  After 6pm go to [email or web address] - Proofreader  [facility]  Office  [phone]  CC: Primary care physician; Idelle Crouch, MD  Note: This dictation was prepared with Dragon dictation along with smaller phrase technology. Any transcriptional errors that result from this process are unintentional.

## 2016-03-20 NOTE — Progress Notes (Addendum)
Visit made. Patient remained asleep and did not awaken to voice. Writer spoke with staff RN Kenney Houseman, who reports Mr. Corky Mull has been quite alert at times, was able to take his oral medications and ate well at breakfast. Possible discharge today. Please note  code status was changed to a DNR on 2/18 and will need a signed portable DNR at discharge. Staff RN Kenney Houseman and CMRN Colletta Maryland made aware.Thank you. Will continue to follow and update hospice team. Thank you. Flo Shanks RN, BSN, [place] [facility] [place], [facility] [phone] c

## 2016-03-21 LAB — CBC
HEMATOCRIT: 30.8 % — AB (ref 40.0–52.0)
Hemoglobin: 10.4 g/dL — ABNORMAL LOW (ref 13.0–18.0)
MCH: 27.4 pg (ref 26.0–34.0)
MCHC: 34 g/dL (ref 32.0–36.0)
MCV: 80.7 fL (ref 80.0–100.0)
Platelets: 262 10*3/uL (ref 150–440)
RBC: 3.81 MIL/uL — ABNORMAL LOW (ref 4.40–5.90)
RDW: 16.9 % — AB (ref 11.5–14.5)
WBC: 7.6 10*3/uL (ref 3.8–10.6)

## 2016-03-21 LAB — GLUCOSE, CAPILLARY
Glucose-Capillary: 128 mg/dL — ABNORMAL HIGH (ref 65–99)
Glucose-Capillary: 174 mg/dL — ABNORMAL HIGH (ref 65–99)

## 2016-03-21 MED ORDER — CIPROFLOXACIN HCL 500 MG PO TABS: 500.0000 mg | ORAL_TABLET | Freq: Two times a day (BID) | ORAL | 0 refills | Status: AC

## 2016-03-21 MED ORDER — METRONIDAZOLE 500 MG PO TABS: 500.0000 mg | ORAL_TABLET | Freq: Three times a day (TID) | ORAL | 0 refills | Status: AC

## 2016-03-21 NOTE — Progress Notes (Signed)
Visit made. Patient seen sitting up in bed, alert, oriented to self, being fed breakfast by student RN. Plan is for discharge home today to continue hospice services. Family made aware in a  Conversation yesterday with Probation officer and Dr. Manuella Ghazi. Completed portable DNR in place in patient's chart. Per request of [name] [facility]) please contact [name] [phone]) to advise of discharge. Contact number given to staff RN Barnabas Lister. Patient will need EMS transport home. CMRN Isaias Cowman made aware. Hospice team updated to planned discharge. Thank you. Flo Shanks RN, BSN, [facility] [place], hospital Liaison [phone] c

## 2016-03-21 NOTE — Progress Notes (Signed)
[place] [facility] [place] NAME: Brent Pollard    MR#:  [id number]  DATE OF BIRTH:  12-24-1926  [place] home with hospice ,resume hospice Services,.  CHIEF COMPLAINT:   Chief Complaint  Patient presents with  . Emesis  . Diarrhea  Feeling better, no n/v/abd pain. Tolerating diet But sleepy REVIEW OF SYSTEMS:  Review of Systems  Constitutional: Negative for chills, fever and weight loss.  HENT: Negative for nosebleeds and sore throat.   Eyes: Negative for blurred vision.  Respiratory: Negative for cough, shortness of breath and wheezing.   Cardiovascular: Negative for chest pain, orthopnea, leg swelling and PND.  Gastrointestinal: Negative for constipation, diarrhea, heartburn, nausea and vomiting.  Genitourinary: Negative for dysuria and urgency.  Musculoskeletal: Negative for back pain.  Skin: Negative for rash.  Neurological: Negative for dizziness, speech change, focal weakness and headaches.  Endo/Heme/Allergies: Does not bruise/bleed easily.  Psychiatric/Behavioral: Negative for depression.    DRUG ALLERGIES:   Allergies  Allergen Reactions  . Ativan [Lorazepam]    VITALS:  Blood pressure 125/75, pulse 100, temperature 97.6 F (36.4 C), temperature source Oral, resp. rate 19, height 6\' 2"  (1.88 m), weight 102.9 kg (226 lb 12.8 oz), SpO2 97 %. PHYSICAL EXAMINATION:  Physical Exam  Constitutional: He is oriented to person, place, and time and well-developed, well-nourished, and in no distress.  HENT:  Head: Normocephalic and atraumatic.  Eyes: Conjunctivae and EOM are normal. Pupils are equal, round, and reactive to light.  Neck: Normal range of motion. Neck supple. No tracheal deviation present. No thyromegaly present.  Cardiovascular: Normal rate, regular rhythm and normal heart sounds.   Pulmonary/Chest: Effort normal and breath sounds normal. No respiratory distress. He has no wheezes. He exhibits no tenderness.   Abdominal: Soft. Bowel sounds are normal. He exhibits no distension. There is no tenderness.  Musculoskeletal: Normal range of motion.  Neurological: He is alert and oriented to person, place, and time. No cranial nerve deficit.  Skin: Skin is warm and dry. No rash noted.  Psychiatric: Mood and affect normal.   LABORATORY PANEL:  Male CBC  Recent Labs Lab 03/21/16 0522  WBC 7.6  HGB 10.4*  HCT 30.8*  PLT 262   ------------------------------------------------------------------------------------------------------------------ Chemistries   Recent Labs Lab 03/17/16 1651 03/18/16 0321  NA 136 136  K 4.6 4.1  CL 102 105  CO2 25 24  GLUCOSE 210* 127*  BUN 20 16  CREATININE 1.44* 1.23  CALCIUM 8.7* 7.9*  AST 25  --   ALT 14*  --   ALKPHOS 99  --   BILITOT 0.8  --    RADIOLOGY:  No results found. ASSESSMENT AND PLAN:  83 year old male with advanced dementia, hypothyroidism, diabetes who presents to the hospital due to nausea vomiting and diarrhea.  1. Colitis-this is a cause of patient's nausea vomiting and diarrhea. Patient's stool for C. difficile is negative. -Diarrhea, nausea vomiting much improved.  -  - can likely switch to PO Cipro & flagyl on D/C tomorrow  2. Leukocytosis - due to # 1.  - d/c home with cipro and flagyl for 9 today. 3. DM - cont. SSI and follow BS  4. Hypothyroidism - cont. Synthroid.   5. BPH - cont. Flomax.   6. Dementia - cont. Seroquel, Remeron  Hospice has been following the patient while in the hospital   All the records are reviewed and case discussed with Care Management/Social Worker. Management plans discussed with the patient,  family (d/w daughter via phone) and they are in agreement.  CODE STATUS: DNR  TOTAL TIME TAKING CARE OF THIS PATIENT: 35 minutes.   More than 50% of the time was spent in counseling/coordination of care: YES  Discharge home with hospice today  Brent Pollard M.D on 03/21/2016 at 10:14  AM  Between 7am to 6pm - Pager - [phone]  After 6pm go to [email or web address] - Proofreader  [facility]  Office  [phone]  CC: Primary care physician; Idelle Crouch, MD  Note: This dictation was prepared with Dragon dictation along with smaller phrase technology. Any transcriptional errors that result from this process are unintentional.

## 2016-03-21 NOTE — Care Management (Signed)
Patient to discharge home with resumption of Hospice services through [place].  Santiago Glad with Hospice aware.   DNR signed and on chart.  Medical necessity completed and on chart.

## 2016-03-21 NOTE — Care Management Important Message (Signed)
Important Message  Patient Details  Name: Brent Pollard MRN: [id number] Date of Birth: 1926/02/11   Medicare Important Message Given:  Yes    Beverly Sessions, RN 03/21/2016, 10:50 AM

## 2016-03-21 NOTE — Progress Notes (Signed)
03/21/2016 1:52 PM  Brent Pollard to be D/C'd Home per MD order.  Discussed prescriptions and follow up appointments with the patient. Prescriptions given to patient, medication list explained in detail. Pt verbalized understanding.  Allergies as of 03/21/2016      Reactions   Ativan [lorazepam]       Medication List    TAKE these medications   albuterol 108 (90 Base) MCG/ACT inhaler Commonly known as:  PROVENTIL HFA;VENTOLIN HFA Inhale 2 puffs into the lungs every 6 (six) hours as needed for wheezing or shortness of breath.   ciprofloxacin 500 MG tablet Commonly known as:  CIPRO Take 1 tablet (500 mg total) by mouth 2 (two) times daily.   cyanocobalamin 1000 MCG tablet Take 1,000 mcg by mouth daily.   diphenoxylate-atropine 2.5-0.025 MG tablet Commonly known as:  LOMOTIL Take by mouth 4 (four) times daily as needed for diarrhea or loose stools.   haloperidol 0.5 MG tablet Commonly known as:  HALDOL Take 1 tablet by mouth 2 (two) times daily.   levothyroxine 75 MCG tablet Commonly known as:  SYNTHROID, LEVOTHROID Take 1 tablet by mouth daily. Notes to patient:  Last given 2/22   metroNIDAZOLE 500 MG tablet Commonly known as:  FLAGYL Take 1 tablet (500 mg total) by mouth 3 (three) times daily.   mirtazapine 7.5 MG tablet Commonly known as:  REMERON Take 7.5 mg by mouth at bedtime.   omeprazole 40 MG capsule Commonly known as:  PRILOSEC Take 1 capsule by mouth daily. Notes to patient:  Last given 03/21/16   QUEtiapine 25 MG tablet Commonly known as:  SEROQUEL Take 1 tablet by mouth 2 (two) times daily. 1 tablet in the morning and 2 tablets at bedtime Notes to patient:  Last given 03/21/16 AM   repaglinide 0.5 MG tablet Commonly known as:  PRANDIN Take 1 tablet by mouth 3 (three) times daily before meals.   tamsulosin 0.4 MG Caps capsule Commonly known as:  FLOMAX Take 1 capsule by mouth daily.   VICTOZA 18 MG/3ML Sopn Generic drug:  liraglutide Inject  1.2 mg into the skin daily.       Vitals:   03/21/16 0700 03/21/16 1346  BP: 125/75 (!) 141/83  Pulse: 100 87  Resp: 19 18  Temp: 97.6 F (36.4 C) 97.4 F (36.3 C)    Skin clean, dry and intact without evidence of skin break down, no evidence of skin tears noted. IV catheter discontinued intact. Site without signs and symptoms of complications. Dressing and pressure applied. Pt denies pain at this time. No complaints noted.  An After Visit Summary was printed and given to the patient. Patient escorted and D/C home via EMS.  Dola Argyle

## 2016-03-22 LAB — CULTURE, BLOOD (ROUTINE X 2)
CULTURE: NO GROWTH
Culture: NO GROWTH
SPECIAL REQUESTS: ADEQUATE
Special Requests: ADEQUATE

## 2016-03-28 NOTE — Discharge Summary (Signed)
Brent Pollard, is a 83 y.o. male  DOB 23-Jun-1926  MRN [id number].  Admission date:  03/17/2016  Admitting Physician  Epifanio Lesches, MD  Discharge Date:  03/21/2016   Primary MD  Idelle Crouch, MD  Recommendations for primary care physician for things to follow:   disCharge home with hospice   Admission Diagnosis  Lactic acidosis [E87.2] Colitis [K52.9] Nausea vomiting and diarrhea [R11.2, R19.7] Sepsis, due to unspecified organism [facility]) [A41.9]   Discharge Diagnosis  Lactic acidosis [E87.2] Colitis [K52.9] Nausea vomiting and diarrhea [R11.2, R19.7] Sepsis, due to unspecified organism ([facility]) [A41.9]    Active Problems:   Sepsis ([place])      Past Medical History:  Diagnosis Date  . Dementia   . Diabetes mellitus without complication ([place])   . Renal disorder   . Thyroid disease     Past Surgical History:  Procedure Laterality Date  . ANKLE SURGERY    . HIP SURGERY    . THYROID SURGERY         History of present illness and  Hospital Course:     Kindly see H&P for history of present illness and admission details, please review complete Labs, Consult reports and Test reports for all details in brief  HPI  from the history and physical done on the day of admission 83 year old male with advanced dementia, hypothyroidism, diabetes who presents to the hospital due to nausea vomiting and diarrhea.   Hospital Course  Colitis-CT scan showed colitis. this is a cause of patient's nausea vomiting and diarrhea. Patient's stool for C. difficile is negative. Received  IV Unasyn in the hospital. Discharged home with Cipro and Flagyl. For 9 days. -Diarrhea, nausea vomiting much improved.  -  -   2. Leukocytosis - due to # 1.  - . 3. DM -  Victoza and prandin  4. Hypothyroidism - cont. Synthroid.    5. BPH - cont. Flomax.   6. Dementia - cont. Seroquel, Remeron Please note  code status was changed to a DNR on 2/18  Hospice has been following the patient while in the hospital    Discharge Condition:    Follow UP      Discharge Instructions  and  Discharge Medications      Allergies as of 03/21/2016      Reactions   Ativan [lorazepam]       Medication List    TAKE these medications   albuterol 108 (90 Base) MCG/ACT inhaler Commonly known as:  PROVENTIL HFA;VENTOLIN HFA Inhale 2 puffs into the lungs every 6 (six) hours as needed for wheezing or shortness of breath.   ciprofloxacin 500 MG tablet Commonly known as:  CIPRO Take 1 tablet (500 mg total) by mouth 2 (two) times daily.   cyanocobalamin 1000 MCG tablet Take 1,000 mcg by mouth daily.   diphenoxylate-atropine 2.5-0.025 MG tablet Commonly known as:  LOMOTIL Take by mouth 4 (four) times daily as needed for diarrhea or loose stools.   haloperidol 0.5 MG tablet Commonly known as:  HALDOL Take 1 tablet by mouth 2 (two) times daily.   levothyroxine 75 MCG tablet Commonly known as:  SYNTHROID, LEVOTHROID Take 1 tablet by mouth daily. Notes to patient:  Last given 2/22   metroNIDAZOLE 500 MG tablet Commonly known as:  FLAGYL Take 1 tablet (500 mg total) by mouth 3 (three) times daily.   mirtazapine 7.5 MG tablet Commonly known as:  REMERON Take 7.5 mg by mouth at bedtime.   omeprazole  40 MG capsule Commonly known as:  PRILOSEC Take 1 capsule by mouth daily. Notes to patient:  Last given 03/21/16   QUEtiapine 25 MG tablet Commonly known as:  SEROQUEL Take 1 tablet by mouth 2 (two) times daily. 1 tablet in the morning and 2 tablets at bedtime Notes to patient:  Last given 03/21/16 AM   repaglinide 0.5 MG tablet Commonly known as:  PRANDIN Take 1 tablet by mouth 3 (three) times daily before meals.   tamsulosin 0.4 MG Caps capsule Commonly known as:  FLOMAX Take 1 capsule by mouth daily.    VICTOZA 18 MG/3ML Sopn Generic drug:  liraglutide Inject 1.2 mg into the skin daily.         Diet and Activity recommendation: See Discharge Instructions above   Consults obtained - Hospice, physical therapy, speech therapy,   Major procedures and Radiology Reports - PLEASE review detailed and final reports for all details, in brief -      Ct Abdomen Pelvis Wo Contrast  Result Date: 03/17/2016 CLINICAL DATA:  Patient with nausea, vomiting and diarrhea for 1 week. EXAM: CT ABDOMEN AND PELVIS WITHOUT CONTRAST TECHNIQUE: Multidetector CT imaging of the abdomen and pelvis was performed following the standard protocol without IV contrast. COMPARISON:  CT abdomen pelvis 08/14/2012. FINDINGS: Lower chest: Normal heart size. Coronary arterial vascular calcifications. No pericardial effusion. Dependent atelectasis within the bilateral lower lobes. Patchy ground-glass opacities within the lingula and left lower lobe. Hepatobiliary: The liver is normal in size and contour. Multiple stones within the gallbladder lumen. Small amount of gas within the gallbladder lumen, nonspecific. No gallbladder wall thickening or pericholecystic fluid. Pancreas: Unremarkable Spleen: Unremarkable Adrenals/Urinary Tract: Stable nodularity of the left adrenal gland. Right adrenal gland is normal. Kidneys are symmetric in size. No hydronephrosis. Urinary bladder is distended. Partially exophytic cyst off the interpolar region of the left and right kidney. Stomach/Bowel: Circumferential wall thickening of the rectum with perirectal fat stranding (image 88; series 2). Additionally there is suggestion of wall thickening of the cecum with surrounding fat stranding. No evidence for bowel obstruction. Normal morphology of the stomach. Vascular/Lymphatic: Normal caliber abdominal aorta. Peripheral calcified atherosclerotic plaque. No retroperitoneal lymphadenopathy. Reproductive: Prostate is mildly enlarged. Other: Small bilateral  fat containing inguinal hernias. Musculoskeletal: Patient status post ORIF complex right hemipelvis fracture involving the acetabulum, ilium and pubic rami. There is soft tissue fullness about the fracture site. Chronic L1 compression deformity. IMPRESSION: There is circumferential wall thickening of the rectum with surrounding fluid and fat stranding. Additionally there is wall thickening of the cecum and part of the ascending colon. Findings are concerning for colitis. Cholelithiasis. Small amount of gas non dependent within the gallbladder lumen is nonspecific in etiology. Recommend correlation for prior sphincterotomy. Findings compatible with prior ORIF complex right hemipelvis fractures. There is soft tissue about the joint space and fractured bones which may be sequelae of the trauma. Superimposed infection cannot be excluded. Aortic atherosclerosis. Electronically Signed   By: Lovey Newcomer M.D.   On: 03/17/2016 18:14   Dg Chest 2 View  Result Date: 03/17/2016 CLINICAL DATA:  83 year old male with history of nausea, vomiting and diarrhea for the past 3 days. EXAM: CHEST  2 VIEW COMPARISON:  Chest x-ray 06/07/2015. FINDINGS: Lung volumes are low. No consolidative airspace disease. Mild scarring in the left lower lobe is unchanged. No pleural effusions. No pneumothorax. No pulmonary nodule or mass noted. Pulmonary vasculature and the cardiomediastinal silhouette are within normal limits. Aortic atherosclerosis. IMPRESSION: 1. Low lung  volumes without radiographic evidence of acute cardiopulmonary disease. 2. Aortic atherosclerosis. Electronically Signed   By: Vinnie Langton M.D.   On: 03/17/2016 18:16    Micro Results    No results found for this or any previous visit (from the past 240 hour(s)).     Today   Subjective:   Clearence Ped today has no headache,no chest abdominal pain,no new weakness tingling or numbness, feels much better wants to go home today.   Objective:   Blood  pressure (!) 141/83, pulse 87, temperature 97.4 F (36.3 C), temperature source Oral, resp. rate 18, height 6\' 2"  (1.88 m), weight 102.9 kg (226 lb 12.8 oz), SpO2 94 %.  No intake or output data in the 24 hours ending 03/28/16 1534  Exam Awake  Has baseline dementia. [place].AT,PERRAL Supple Neck,No JVD, No cervical lymphadenopathy appriciated.  Symmetrical Chest wall movement, Good air movement bilaterally, CTAB RRR,No Gallops,Rubs or new Murmurs, No Parasternal Heave +ve B.Sounds, Abd Soft, Non tender, No organomegaly appriciated, No rebound -guarding or rigidity. No Cyanosis, Clubbing or edema, No new Rash or bruise  Data Review   CBC w Diff:  Lab Results  Component Value Date   WBC 7.6 03/21/2016   HGB 10.4 (L) 03/21/2016   HGB 13.0 04/30/2013   HCT 30.8 (L) 03/21/2016   HCT 39.9 (L) 04/30/2013   PLT 262 03/21/2016   PLT 237 04/30/2013   LYMPHOPCT 15 06/07/2015   LYMPHOPCT 9.4 07/20/2012   MONOPCT 13 06/07/2015   MONOPCT 14.8 07/20/2012   EOSPCT 3 06/07/2015   EOSPCT 1.6 07/20/2012   BASOPCT 1 06/07/2015   BASOPCT 0.4 07/20/2012    CMP:  Lab Results  Component Value Date   NA 136 03/18/2016   NA 137 04/30/2013   K 4.1 03/18/2016   K 4.4 04/30/2013   CL 105 03/18/2016   CL 105 04/30/2013   CO2 24 03/18/2016   CO2 28 04/30/2013   BUN 16 03/18/2016   BUN 20 (H) 04/30/2013   CREATININE 1.23 03/18/2016   CREATININE 1.37 (H) 04/30/2013   PROT 7.7 03/17/2016   PROT 7.7 07/17/2012   ALBUMIN 3.6 03/17/2016   ALBUMIN 3.4 07/17/2012   BILITOT 0.8 03/17/2016   BILITOT 1.0 07/17/2012   ALKPHOS 99 03/17/2016   ALKPHOS 86 07/17/2012   AST 25 03/17/2016   AST 17 07/17/2012   ALT 14 (L) 03/17/2016   ALT 15 07/17/2012  .   Total Time in preparing paper work, data evaluation and todays exam - 48 minutes  Glynis Hunsucker M.D on 03/21/2016 at 3:34 PM    Note: This dictation was prepared with Dragon dictation along with smaller phrase technology. Any transcriptional  errors that result from this process are unintentional.

## 2016-04-29 DEATH — deceased
